# Patient Record
Sex: Male | Born: 1945 | Race: Black or African American | Hispanic: Yes | Marital: Married | Smoking: Never smoker
Health system: Southern US, Community
[De-identification: ages and names within clinical notes are randomized; demographics above are authoritative.]

## PROBLEM LIST (undated history)

## (undated) DIAGNOSIS — R55 Syncope and collapse: Secondary | ICD-10-CM

## (undated) DIAGNOSIS — I340 Nonrheumatic mitral (valve) insufficiency: Secondary | ICD-10-CM

## (undated) DIAGNOSIS — I471 Supraventricular tachycardia: Secondary | ICD-10-CM

## (undated) DIAGNOSIS — I4719 Other supraventricular tachycardia: Secondary | ICD-10-CM

## (undated) DIAGNOSIS — N400 Enlarged prostate without lower urinary tract symptoms: Secondary | ICD-10-CM

## (undated) DIAGNOSIS — G5600 Carpal tunnel syndrome, unspecified upper limb: Secondary | ICD-10-CM

## (undated) DIAGNOSIS — E039 Hypothyroidism, unspecified: Secondary | ICD-10-CM

## (undated) DIAGNOSIS — I495 Sick sinus syndrome: Secondary | ICD-10-CM

## (undated) DIAGNOSIS — I959 Hypotension, unspecified: Secondary | ICD-10-CM

## (undated) DIAGNOSIS — Z95810 Presence of automatic (implantable) cardiac defibrillator: Secondary | ICD-10-CM

## (undated) DIAGNOSIS — I2699 Other pulmonary embolism without acute cor pulmonale: Secondary | ICD-10-CM

## (undated) DIAGNOSIS — I428 Other cardiomyopathies: Secondary | ICD-10-CM

## (undated) DIAGNOSIS — I10 Essential (primary) hypertension: Secondary | ICD-10-CM

## (undated) DIAGNOSIS — E785 Hyperlipidemia, unspecified: Secondary | ICD-10-CM

## (undated) HISTORY — PX: BLADDER SURGERY: SHX569

---

## 2009-11-14 HISTORY — PX: INSERTION OF ICD: SHX6689

## 2015-05-01 HISTORY — PX: HERNIA REPAIR: SHX51

## 2016-02-05 ENCOUNTER — Ambulatory Visit (HOSPITAL_BASED_OUTPATIENT_CLINIC_OR_DEPARTMENT_OTHER)
Admission: RE | Admit: 2016-02-05 | Discharge: 2016-02-05 | Disposition: A | Discharge status: Home / Self Care | Payer: Medicare Other | Source: Ambulatory Visit | Attending: Internal Medicine | Admitting: Internal Medicine

## 2016-02-05 ENCOUNTER — Ambulatory Visit (HOSPITAL_COMMUNITY)
Admission: RE | Admit: 2016-02-05 | Discharge: 2016-02-05 | Disposition: A | Discharge status: Home / Self Care | Payer: Medicare Other | Source: Ambulatory Visit | Attending: Internal Medicine | Admitting: Internal Medicine

## 2016-02-05 ENCOUNTER — Encounter: Payer: Self-pay | Admitting: Internal Medicine

## 2016-02-05 ENCOUNTER — Encounter (HOSPITAL_COMMUNITY): Payer: Self-pay | Admitting: Internal Medicine

## 2016-02-05 VITALS — BP 100/72 | HR 82 | Resp 18 | Wt 160.5 lb

## 2016-02-05 DIAGNOSIS — Z95810 Presence of automatic (implantable) cardiac defibrillator: Secondary | ICD-10-CM | POA: Diagnosis not present

## 2016-02-05 DIAGNOSIS — I110 Hypertensive heart disease with heart failure: Secondary | ICD-10-CM | POA: Insufficient documentation

## 2016-02-05 DIAGNOSIS — E039 Hypothyroidism, unspecified: Secondary | ICD-10-CM | POA: Insufficient documentation

## 2016-02-05 DIAGNOSIS — Z8249 Family history of ischemic heart disease and other diseases of the circulatory system: Secondary | ICD-10-CM | POA: Diagnosis not present

## 2016-02-05 DIAGNOSIS — I495 Sick sinus syndrome: Secondary | ICD-10-CM | POA: Diagnosis not present

## 2016-02-05 DIAGNOSIS — Z888 Allergy status to other drugs, medicaments and biological substances status: Secondary | ICD-10-CM | POA: Insufficient documentation

## 2016-02-05 DIAGNOSIS — I5032 Chronic diastolic (congestive) heart failure: Secondary | ICD-10-CM | POA: Diagnosis not present

## 2016-02-05 DIAGNOSIS — C679 Malignant neoplasm of bladder, unspecified: Secondary | ICD-10-CM | POA: Insufficient documentation

## 2016-02-05 DIAGNOSIS — Z79899 Other long term (current) drug therapy: Secondary | ICD-10-CM | POA: Diagnosis not present

## 2016-02-05 DIAGNOSIS — I509 Heart failure, unspecified: Secondary | ICD-10-CM

## 2016-02-05 DIAGNOSIS — I4891 Unspecified atrial fibrillation: Secondary | ICD-10-CM | POA: Diagnosis not present

## 2016-02-05 DIAGNOSIS — Z7982 Long term (current) use of aspirin: Secondary | ICD-10-CM | POA: Insufficient documentation

## 2016-02-05 DIAGNOSIS — Z86711 Personal history of pulmonary embolism: Secondary | ICD-10-CM | POA: Diagnosis not present

## 2016-02-05 DIAGNOSIS — Z7901 Long term (current) use of anticoagulants: Secondary | ICD-10-CM | POA: Diagnosis not present

## 2016-02-05 DIAGNOSIS — Z823 Family history of stroke: Secondary | ICD-10-CM | POA: Diagnosis not present

## 2016-02-05 DIAGNOSIS — E785 Hyperlipidemia, unspecified: Secondary | ICD-10-CM | POA: Diagnosis not present

## 2016-02-05 HISTORY — DX: Other supraventricular tachycardia: I47.19

## 2016-02-05 HISTORY — DX: Hypothyroidism, unspecified: E03.9

## 2016-02-05 HISTORY — DX: Supraventricular tachycardia: I47.1

## 2016-02-05 HISTORY — DX: Hyperlipidemia, unspecified: E78.5

## 2016-02-05 HISTORY — DX: Sick sinus syndrome: I49.5

## 2016-02-05 HISTORY — DX: Other pulmonary embolism without acute cor pulmonale: I26.99

## 2016-02-05 HISTORY — DX: Benign prostatic hyperplasia without lower urinary tract symptoms: N40.0

## 2016-02-05 HISTORY — DX: Other cardiomyopathies: I42.8

## 2016-02-05 HISTORY — DX: Nonrheumatic mitral (valve) insufficiency: I34.0

## 2016-02-05 HISTORY — DX: Essential (primary) hypertension: I10

## 2016-02-05 HISTORY — DX: Syncope and collapse: R55

## 2016-02-05 LAB — COMPREHENSIVE METABOLIC PANEL
ALBUMIN: 3.3 g/dL — AB (ref 3.5–5.0)
ALK PHOS: 157 U/L — AB (ref 38–126)
ALT: 18 U/L (ref 17–63)
ANION GAP: 11 (ref 5–15)
AST: 36 U/L (ref 15–41)
BUN: 27 mg/dL — ABNORMAL HIGH (ref 6–20)
CALCIUM: 9.6 mg/dL (ref 8.9–10.3)
CO2: 27 mmol/L (ref 22–32)
Chloride: 100 mmol/L — ABNORMAL LOW (ref 101–111)
Creatinine, Ser: 1.25 mg/dL — ABNORMAL HIGH (ref 0.61–1.24)
GFR calc Af Amer: >60 mL/min (ref >60)
GFR calc non Af Amer: 57 mL/min — ABNORMAL LOW (ref >60)
GLUCOSE: 79 mg/dL (ref 65–99)
POTASSIUM: 4.3 mmol/L (ref 3.5–5.1)
SODIUM: 138 mmol/L (ref 135–145)
Total Bilirubin: 2.1 mg/dL — ABNORMAL HIGH (ref 0.3–1.2)
Total Protein: 7.2 g/dL (ref 6.5–8.1)

## 2016-02-05 LAB — CBC
HEMATOCRIT: 38.1 % — AB (ref 39.0–52.0)
HEMOGLOBIN: 12.6 g/dL — AB (ref 13.0–17.0)
MCH: 32.6 pg (ref 26.0–34.0)
MCHC: 33.1 g/dL (ref 30.0–36.0)
MCV: 98.7 fL (ref 78.0–100.0)
Platelets: 136 10*3/uL — ABNORMAL LOW (ref 150–400)
RBC: 3.86 MIL/uL — ABNORMAL LOW (ref 4.22–5.81)
RDW: 15.1 % (ref 11.5–15.5)
WBC: 5 10*3/uL (ref 4.0–10.5)

## 2016-02-05 LAB — ECHOCARDIOGRAM COMPLETE
EWDT: 222 ms
FS: 21 % — AB (ref 28–44)
IVS/LV PW RATIO, ED: 0.68
LDCA: 3.14 cm2
LEFT ATRIUM END SYS DIAM: 47 cm
LV PW d: 25 mm — AB (ref 0.6–1.1)
Simpson's disk: 45
Weight: 2568 oz

## 2016-02-05 LAB — FERRITIN: Ferritin: 129 ng/mL (ref 24–336)

## 2016-02-05 MED ORDER — POTASSIUM CHLORIDE CRYS ER 20 MEQ PO TBCR: EXTENDED_RELEASE_TABLET | ORAL | Status: DC

## 2016-02-05 MED ORDER — METOLAZONE 2.5 MG PO TABS: ORAL_TABLET | ORAL | Status: DC

## 2016-02-05 NOTE — Progress Notes (Signed)
Patient ID: Kirk Perkins, male   DOB: Aug 18, 1946, 70 y.o.   MRN: [id number]   ADVANCED HF CLINIC CONSULT NOTE  Referring Physician: Dr. Patsi Perkins ([place]) Primary Cardiologist: Dr. Chancy Perkins  HPI:  Mr. Kirk Perkins is a 70 y/o male with HTN, HL, hypertrophic/restrictive CM with recurrent syncope s/p St. JudeICD, atrial tachycardia and PE referred by Dr. Chancy Perkins for further HF evaluation.  Mr. Kirk Perkins has appeared to carry the diagnosis of HOCM for over 15 years based on echo findings of LVH and mild SAM. Underwent ST Jude ICD placement in 2009 due to recurrent syncope.  Recently struggling more with volume overload and SOB. He has multiple admissions to the hospital for LE edema requiring IV diuresis. Echo in 10/16 reportedly showed EF 50-55% with moderate- severe (progressive) LVH with increased echogenicity raising suspicion for amyloid. Fat abdominal fat pas biopsy which was normal. Last admission was in 1/17 removed 20 pounds of fluid. Switched from lasix to bumex 1.5 bid and that has helped significantly. Never takes extra. Weighing every day. Weight relatively stable 155-160.  Watching fluid and salt intake very carefully. No orthopnea/PND. Sleeps in recliner. + edema. Does not recall having super high BP when he was a young man. Denies ETOH abuse   R/L Cath 10/16 Minimal CAD LM 30-40% LAD ok LCX RCA 40%p 30% mid  LVEF 80% LVEDP 15 RA 16 RV 36/10 PA 30/13 Fick CO 3.1/1.6  St Jude interrogation: AF at least since 12/16 (maybe longer) poor sensing on atrial lead. Battery life 4 months left    Review of Systems: [y] = yes, [ ]  = no   General: Weight gain [ y]; Weight loss [ ] ; Anorexia [ ] ; Fatigue Blue.Reese ]; Fever [ ] ; Chills [ ] ; Weakness Blue.Reese ]  Cardiac: Chest pain/pressure [ ] ; Resting SOB [ ] ; Exertional SOB [ y]; Orthopnea [ ] ; Pedal Edema [ y]; Palpitations [ ] ; Syncope [ y]; Presyncope [ ] ; Paroxysmal nocturnal dyspnea[ ]   Pulmonary: Cough [ ] ; Wheezing[ ] ; Hemoptysis[  ]; Sputum [ ] ; Snoring [ ]   GI: Vomiting[ ] ; Dysphagia[ ] ; Melena[ ] ; Hematochezia [ ] ; Heartburn[ ] ; Abdominal pain [ ] ; Constipation [ ] ; Diarrhea [ ] ; BRBPR [ ]   GU: Hematuria[ ] ; Dysuria [ ] ; Nocturia[ ]   Vascular: Pain in legs with walking [ ] ; Pain in feet with lying flat [ ] ; Non-healing sores [ ] ; Stroke [ ] ; TIA [ ] ; Slurred speech [ ] ;  Neuro: Headaches[ ] ; Vertigo[ ] ; Seizures[ ] ; Paresthesias[ ] ;Blurred vision [ ] ; Diplopia [ ] ; Vision changes [ ]   Ortho/Skin: Arthritis Blue.Reese ]; Joint pain Blue.Reese ]; Muscle pain [ ] ; Joint swelling [ ] ; Back Pain [ ] ; Rash [ ]   Psych: Depression[ ] ; Anxiety[ ]   Heme: Bleeding problems [ ] ; Clotting disorders [ ] ; Anemia [ ]   Endocrine: Diabetes [ ] ; Thyroid dysfunction[ ]    Past Medical History  Diagnosis Date  . Syncope   . Obstructive cardiomyopathy ([place])   . Sick sinus syndrome ([place])   . HTN (hypertension)   . Hypothyroidism   . Benign prostatic hypertrophy   . Hyperlipidemia   . Atrial tachycardia ([place])   . Pulmonary emboli ([place])   . Mitral valve insufficiency     Current Outpatient Prescriptions  Medication Sig Dispense Refill  . allopurinol (ZYLOPRIM) 100 MG tablet Take 100 mg by mouth daily.    Marland Kitchen apixaban (ELIQUIS) 5 MG TABS tablet Take 5 mg by mouth 2 (two) times daily.    Marland Kitchen  aspirin 81 MG tablet Take 81 mg by mouth daily.    . bumetanide (BUMEX) 1 MG tablet Take 1.5 mg by mouth 2 (two) times daily.    . calcitRIOL (ROCALTROL) 0.25 MCG capsule Take 0.25 mcg by mouth 4 (four) times a week.    . carvedilol (COREG) 3.125 MG tablet Take 3.125 mg by mouth 2 (two) times daily with a meal.    . ferrous sulfate 325 (65 FE) MG tablet Take 325 mg by mouth daily with breakfast.    . levothyroxine (SYNTHROID, LEVOTHROID) 88 MCG tablet Take 88 mcg by mouth daily before breakfast.    . lovastatin (MEVACOR) 20 MG tablet Take 20 mg by mouth at bedtime.    . tamsulosin (FLOMAX) 0.4 MG CAPS capsule Take 0.4 mg by mouth daily after supper.     No  current facility-administered medications for this encounter.    Allergies  Allergen Reactions  . Ace Inhibitors Swelling      Social History   Social History  . Marital Status: Married    Spouse Name: N/A  . Number of Children: 3  . Years of Education: N/A   Occupational History  . retired    Social History Main Topics  . Smoking status: Not on file  . Smokeless tobacco: Not on file  . Alcohol Use: No  . Drug Use: Not on file  . Sexual Activity: Not on file   Other Topics Concern  . Not on file   Social History Narrative      Family History  Problem Relation Age of Onset  . Stroke Father   Mother had HTN died from natural causes 2 sisters: no heart problems No family H/o cardiomyopathy  Filed Vitals:   02/05/16 1206  BP: 100/72  Pulse: 82  Resp: 18  Weight: 160 lb 8 oz (72.802 kg)  SpO2: 98%    PHYSICAL EXAM: General:  Elderly male NAD. No respiratory difficulty HEENT: normal Neck: supple. JVP to jaw with prominent CV waves Carotids 2+ bilat; no bruits. No lymphadenopathy or thryomegaly appreciated. Cor: PMI nondisplaced. Regular rate & rhythm. 2/6 TR  Lungs: clear Abdomen: soft, nontender, nondistended. Liver edge down slightly No bruits or masses. Good bowel sounds. Extremities: no cyanosis, clubbing, rash, 2+ chronic woody edema Neuro: alert & oriented x 3, cranial nerves grossly intact. moves all 4 extremities w/o difficulty. Affect pleasant.  ECG: Atrial fibrillation with v-pacing normal voltage   ASSESSMENT & PLAN: 1. Chronic diastolic HF due to possible hypertrophic CM s/p St Jude ICD 2. Recurrent AF    --CHADSVASC = 4 This patients CHA2DS2-VASc Score and unadjusted Ischemic Stroke Rate (% per year) is equal to 4.8 % stroke rate/year from a score of 4 3. St Jude ICD    --4 months left on battery. Low sensing on a-lead 4. HTN 5. H/o PE  6. Bladder CA  Very complicated case. He has R>>L heart failure with great difficulty managing his  volume status.   My first concern when reviewing his case was for cardiac amyloidosis given his RHF and severe LVH out of proportion to his reported level of HTN. However his volts on ECG are normal and has had a negative fat pad biopsy. Unfortunately cannot get cMRI to further evaluate due ICD. Will repeat echo and check ferritin, SPEP and UPEP. He may also have hypertrophic CM but he carries no FHx for this.   lthough he may have a component of restriction RHC results most c/w primary RV  failure and not biventricular failure. Recurrent AF may also be playing a role.  For now, I would like to repeat his echo and once we have these images we can make a decision on next steps. Suspect we will need to repeat his RHC. Given residual volume overload will add metolazone 2.5 2x/week with kcl 20 each time. If atria not overly enlarged on echo may consider DC-CV as well. Need for endomyocardial biopsy remains a possibility.   Will have him see EP regard device EOL and poor sensing on atrial lead.   Bensimhon, Daniel,MD 5:46 PM

## 2016-02-05 NOTE — Progress Notes (Signed)
*  PRELIMINARY RESULTS* Echocardiogram 2D Echocardiogram has been performed.  Kirk Perkins 02/05/2016, 2:52 PM

## 2016-02-05 NOTE — Patient Instructions (Addendum)
Echocardiogram today.  Routine lab work performed today. Will call you with ABNORMAL results.  Take Metolazone 2.5mg  every Monday, Wednesday, and Friday.  Take Potassium 7meq with Metolazone.  Follow up in 3 weeks with Dr.Klein and Dr.Bensimhon.

## 2016-02-06 LAB — PROTEIN ELECTROPHORESIS, SERUM
A/G Ratio: 0.9 (ref 0.7–1.7)
ALPHA-1-GLOBULIN: 0.3 g/dL (ref 0.0–0.4)
Albumin ELP: 3.6 g/dL (ref 2.9–4.4)
Alpha-2-Globulin: 0.6 g/dL (ref 0.4–1.0)
Beta Globulin: 1.2 g/dL (ref 0.7–1.3)
GAMMA GLOBULIN: 1.8 g/dL (ref 0.4–1.8)
GLOBULIN, TOTAL: 3.9 g/dL (ref 2.2–3.9)
TOTAL PROTEIN ELP: 7.5 g/dL (ref 6.0–8.5)

## 2016-02-06 LAB — PROTEIN ELECTRO, RANDOM URINE
ALBUMIN ELP UR: 100 %
Alpha-1-Globulin, U: 0 %
Alpha-2-Globulin, U: 0 %
BETA GLOBULIN, U: 0 %
GAMMA GLOBULIN, U: 0 %
TOTAL PROTEIN, URINE-UPE24: 10.8 mg/dL

## 2016-02-12 ENCOUNTER — Encounter (HOSPITAL_COMMUNITY): Payer: Self-pay

## 2016-02-12 NOTE — Progress Notes (Signed)
Adv HF consult note, echo, and labs from new patient appointment visit faxed to Dr. Chancy Milroy with [facility] per Dr. bensimhon's request.

## 2016-02-24 ENCOUNTER — Ambulatory Visit (HOSPITAL_COMMUNITY)
Admission: RE | Admit: 2016-02-24 | Discharge: 2016-02-24 | Disposition: A | Discharge status: Home / Self Care | Payer: Medicare Other | Source: Ambulatory Visit | Attending: Internal Medicine | Admitting: Internal Medicine

## 2016-02-24 ENCOUNTER — Encounter: Payer: Self-pay | Admitting: Internal Medicine

## 2016-02-24 ENCOUNTER — Ambulatory Visit (INDEPENDENT_AMBULATORY_CARE_PROVIDER_SITE_OTHER): Payer: Medicare Other | Admitting: Internal Medicine

## 2016-02-24 ENCOUNTER — Encounter (HOSPITAL_COMMUNITY): Payer: Self-pay | Admitting: Internal Medicine

## 2016-02-24 VITALS — BP 90/70 | HR 80 | Ht 70.0 in | Wt 148.2 lb

## 2016-02-24 VITALS — BP 106/64 | HR 76 | Wt 148.2 lb

## 2016-02-24 DIAGNOSIS — Z86711 Personal history of pulmonary embolism: Secondary | ICD-10-CM | POA: Insufficient documentation

## 2016-02-24 DIAGNOSIS — Z95810 Presence of automatic (implantable) cardiac defibrillator: Secondary | ICD-10-CM | POA: Insufficient documentation

## 2016-02-24 DIAGNOSIS — Z7982 Long term (current) use of aspirin: Secondary | ICD-10-CM | POA: Diagnosis not present

## 2016-02-24 DIAGNOSIS — E039 Hypothyroidism, unspecified: Secondary | ICD-10-CM | POA: Insufficient documentation

## 2016-02-24 DIAGNOSIS — I5032 Chronic diastolic (congestive) heart failure: Secondary | ICD-10-CM

## 2016-02-24 DIAGNOSIS — I509 Heart failure, unspecified: Secondary | ICD-10-CM

## 2016-02-24 DIAGNOSIS — Z7901 Long term (current) use of anticoagulants: Secondary | ICD-10-CM | POA: Insufficient documentation

## 2016-02-24 DIAGNOSIS — I340 Nonrheumatic mitral (valve) insufficiency: Secondary | ICD-10-CM | POA: Diagnosis not present

## 2016-02-24 DIAGNOSIS — C679 Malignant neoplasm of bladder, unspecified: Secondary | ICD-10-CM | POA: Diagnosis not present

## 2016-02-24 DIAGNOSIS — I480 Paroxysmal atrial fibrillation: Secondary | ICD-10-CM

## 2016-02-24 DIAGNOSIS — N400 Enlarged prostate without lower urinary tract symptoms: Secondary | ICD-10-CM | POA: Diagnosis not present

## 2016-02-24 DIAGNOSIS — I110 Hypertensive heart disease with heart failure: Secondary | ICD-10-CM | POA: Insufficient documentation

## 2016-02-24 DIAGNOSIS — I4891 Unspecified atrial fibrillation: Secondary | ICD-10-CM | POA: Insufficient documentation

## 2016-02-24 DIAGNOSIS — G629 Polyneuropathy, unspecified: Secondary | ICD-10-CM

## 2016-02-24 DIAGNOSIS — Z823 Family history of stroke: Secondary | ICD-10-CM | POA: Insufficient documentation

## 2016-02-24 DIAGNOSIS — I5022 Chronic systolic (congestive) heart failure: Secondary | ICD-10-CM | POA: Diagnosis present

## 2016-02-24 DIAGNOSIS — Z888 Allergy status to other drugs, medicaments and biological substances status: Secondary | ICD-10-CM | POA: Diagnosis not present

## 2016-02-24 DIAGNOSIS — I495 Sick sinus syndrome: Secondary | ICD-10-CM | POA: Diagnosis not present

## 2016-02-24 DIAGNOSIS — Z79899 Other long term (current) drug therapy: Secondary | ICD-10-CM | POA: Insufficient documentation

## 2016-02-24 DIAGNOSIS — Z8249 Family history of ischemic heart disease and other diseases of the circulatory system: Secondary | ICD-10-CM | POA: Diagnosis not present

## 2016-02-24 DIAGNOSIS — E785 Hyperlipidemia, unspecified: Secondary | ICD-10-CM | POA: Diagnosis not present

## 2016-02-24 LAB — BASIC METABOLIC PANEL
Anion gap: 13 (ref 5–15)
BUN: 63 mg/dL — AB (ref 6–20)
CALCIUM: 9.5 mg/dL (ref 8.9–10.3)
CHLORIDE: 92 mmol/L — AB (ref 101–111)
CO2: 30 mmol/L (ref 22–32)
CREATININE: 1.4 mg/dL — AB (ref 0.61–1.24)
GFR calc Af Amer: 57 mL/min — ABNORMAL LOW (ref >60)
GFR calc non Af Amer: 49 mL/min — ABNORMAL LOW (ref >60)
Glucose, Bld: 88 mg/dL (ref 65–99)
Potassium: 3.7 mmol/L (ref 3.5–5.1)
SODIUM: 135 mmol/L (ref 135–145)

## 2016-02-24 LAB — CUP PACEART INCLINIC DEVICE CHECK
Battery Remaining Longevity: 2.8
Battery Voltage: 2.48 V
Brady Statistic RA Percent Paced: 4.6 %
Brady Statistic RV Percent Paced: 23 %
HIGH POWER IMPEDANCE MEASURED VALUE: 67.5 Ohm
Implantable Lead Implant Date: 20101217
Implantable Lead Implant Date: 20101217
Implantable Lead Location: 753859
Lead Channel Impedance Value: 425 Ohm
Lead Channel Pacing Threshold Amplitude: 1 V
Lead Channel Sensing Intrinsic Amplitude: 0.3 mV
Lead Channel Setting Pacing Amplitude: 2.5 V
Lead Channel Setting Pacing Amplitude: 2.5 V
Lead Channel Setting Pacing Pulse Width: 0.5 ms
MDC IDC LEAD LOCATION: 753860
MDC IDC MSMT LEADCHNL RV IMPEDANCE VALUE: 350 Ohm
MDC IDC MSMT LEADCHNL RV PACING THRESHOLD PULSEWIDTH: 0.5 ms
MDC IDC MSMT LEADCHNL RV SENSING INTR AMPL: 8.1 mV
MDC IDC PG SERIAL: 544575
MDC IDC SESS DTM: 20170327155443
MDC IDC SET LEADCHNL RV SENSING SENSITIVITY: 0.3 mV

## 2016-02-24 MED ORDER — METOLAZONE 2.5 MG PO TABS: 2.5000 mg | ORAL_TABLET | ORAL | Status: DC

## 2016-02-24 NOTE — Progress Notes (Signed)
Patient ID: Kirk Perkins, male   DOB: [birth year], 70 y.o.   MRN: [id number]   ADVANCED HF CLINIC NOTE  Referring Physician: Dr. Patsi Sears ([place]) Primary Cardiologist: Dr. Chancy Milroy  HPI:  Kirk Perkins is a 70 y/o male with HTN, HL, hypertrophic/restrictive CM with recurrent syncope s/p St. Jude ICD, atrial tachycardia and PE referred by Dr. Chancy Milroy for further HF evaluation.  Kirk Perkins has appeared to carry the diagnosis of HOCM for over 15 years based on echo findings of LVH and mild SAM. Underwent ST Jude ICD placement in 2009 due to recurrent syncope.  We saw him for the first time in consult 02/05/16 as he was struggling more with volume overload and SOB. He has multiple admissions to the hospital for LE edema requiring IV diuresis. Echo in 10/16 reportedly showed EF 50-55% with moderate- severe (progressive) LVH with increased echogenicity raising suspicion for amyloid. Fat abdominal fat pas biopsy which was normal. At that time we added metolazone 2.5 2x/week with kcl 20 each time. SPEP/UPEP negative.Ferritin ok.  Repeat echo ordered EF 40-45% with very thick speckled myocardium highly suggestive of amyloid. Severe septal HK. RV moderately decreased function. LA 4.7 cm with moderate MR.  Also found to be back in AF since 12/16 on device interrogation.   Here is here for f/u: Says he has been taking metolazone 3x/week (M/W/F) - we had initially written for twice per week. Weight down 12 pounds since we last saw him. Breathing doing better. Can do ADLs without too much problem. BP can run  90-105 but no dizziness. Edema improved. No palpitations.   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 Battery life 4 months left     Past Medical History  Diagnosis Date  . Syncope   . Obstructive  cardiomyopathy ([place])   . Sick sinus syndrome ([place])   . HTN (hypertension)   . Hypothyroidism   . Benign prostatic hypertrophy   . Hyperlipidemia   . Atrial tachycardia ([place])   . Pulmonary emboli ([place])   . Mitral valve insufficiency     Current Outpatient Prescriptions  Medication Sig Dispense Refill  . allopurinol (ZYLOPRIM) 100 MG tablet Take 100 mg by mouth daily.    Marland Kitchen apixaban (ELIQUIS) 5 MG TABS tablet Take 5 mg by mouth 2 (two) times daily.    Marland Kitchen aspirin 81 MG tablet Take 81 mg by mouth daily.    . bumetanide (BUMEX) 1 MG tablet Take 1.5 mg by mouth 2 (two) times daily.    . calcitRIOL (ROCALTROL) 0.25 MCG capsule Take 0.25 mcg by mouth 4 (four) times a week.    . ferrous sulfate 325 (65 FE) MG tablet Take 325 mg by mouth daily with breakfast.    . levothyroxine (SYNTHROID, LEVOTHROID) 88 MCG tablet Take 88 mcg by mouth daily before breakfast.    . lovastatin (MEVACOR) 20 MG tablet Take 20 mg by mouth at bedtime.    . metolazone (ZAROXOLYN) 2.5 MG tablet Take every Monday, Wednesday, and Friday 15 tablet 3  . potassium chloride SA (K-DUR,KLOR-CON) 20 MEQ tablet Take 1 tablet Every Monday, Wednesday, and Friday with Metolazone. 15 tablet 3  . tamsulosin (FLOMAX) 0.4 MG CAPS capsule Take 0.4 mg by mouth daily after supper.  No current facility-administered medications for this encounter.    Allergies  Allergen Reactions  . Ace Inhibitors Swelling      Social History   Social History  . Marital Status: Married    Spouse Name: N/A  . Number of Children: 3  . Years of Education: N/A   Occupational History  . retired    Social History Main Topics  . Smoking status: Never Smoker   . Smokeless tobacco: Not on file  . Alcohol Use: No  . Drug Use: Not on file  . Sexual Activity: Not on file   Other Topics Concern  . Not on file   Social History Narrative      Family History  Problem Relation Age of Onset  . Stroke Father   Mother had HTN died from natural  causes 2 sisters: no heart problems No family H/o cardiomyopathy  Filed Vitals:   02/24/16 1101  BP: 106/64  Pulse: 76  Weight: 148 lb 4 oz (67.246 kg)  SpO2: 97%    PHYSICAL EXAM: General:  Elderly male NAD. No respiratory difficulty HEENT: normal Neck: supple. JVP to jaw with prominent CV waves Carotids 2+ bilat; no bruits. No lymphadenopathy or thryomegaly appreciated. Cor: PMI nondisplaced. Regular rate & rhythm. 2/6 TR  Lungs: clear Abdomen: soft, nontender, nondistended. Liver edge down slightly No bruits or masses. Good bowel sounds. Extremities: no cyanosis, clubbing, rash, 1+ chronic woody edema Neuro: alert & oriented x 3, cranial nerves grossly intact. moves all 4 extremities w/o difficulty. Affect pleasant.  ECG: Atrial fibrillation with v-pacing normal voltage   ASSESSMENT & PLAN: 1. Chronic systolic/diastolic HF due to possible hypertrophic CM s/p St Jude ICD with R>>L heart failure symptoms     --Echo 02/05/16 EF 40-45% with very thick speckled myocardium highly suggestive of amyloid. Severe septal HK. RV moderately decreased function. LA 4.7 cm with moderate MR.     --NYHA II-III. Volume status improving with metolazone but still elevated. Will cut metolazone back to 2x/week to avoid overdiuresis particularly with RV failure     --Long talk with him and his family about situation. In looking at his echo, I think that he has amyloid until proven otherwise. Given negative SPEP/UPEP my concern is for TTR amyloidosis. This is confounded by negative fat pad biopsy and normal volts on ECG. I feel strongly that he needs endomyocardial biopsy and will arrange this with Dr. Pilar Plate at [facility].  2. Recurrent AF    --CHADSVASC = 4 This patients CHA2DS2-VASc Score and unadjusted Ischemic Stroke Rate (% per year) is equal to 4.8 % stroke rate/year from a score of 4     --He has been in AF since 12/16 by ICD interrogation. He is on apixaban. My initial feeling was to plan DC-CV hoping that  restoration of NSR would markedly improve his HF especially in light of his advanced diastolic dysfunction. However his symptoms are markedly improved with diuresis and he has a LA diameter of nearly 5.0cm making recurrence likely. With need to stop [facility] for biopsy will hold off on DC-CV for now. If we choose DC-CV he will likely need amio.  3. St Jude ICD    --Now at [facility]. Saw Dr Caryl Comes today. Will discuss with him.  4. HTN    --BP now on low side 5. H/o PE     --remains on apixaban 6. Bladder CA   Elester Apodaca,MD 11:40 AM

## 2016-02-24 NOTE — Progress Notes (Signed)
ELECTROPHYSIOLOGY CONSULT NOTE  Patient ID: Kirk Perkins, MRN: [id number], DOB/AGE: [birth year] 70 y.o. Admit date: (Not on file) Date of Consult: 02/24/2016  Primary Physician: PROVIDER NOT IN SYSTEM Primary Cardiologist: DB Consulting Physician CHF  Chief Complaint:     HPI Kirk Perkins is a 70 y.o. male  Referred for previously implanted ICD now at [facility].  He has poor sensing was atrial lead.  He has a history of hypertrophic/restrictive cardiomyopathy with syncope. Ejection fraction 10/16 demonstrated EF 50%. Wall thicknesses were described as 25 mm posteriorly and 17 mm and the septum.  Left atrial enlargement 47. Index volumes are not available qualitatively moderate-severe  He has heart failure with right than left with peripheral edema which is markedly improved on the new diuretic regime.  There are issues regarding amyloid;  Echogenicity raises a question about amyloid. Fat pad  biopsies were negative.  Biopsy apparently is anticipated. Interestingly voltages are normal. He also has a peripheral neuropathy so he cannot button things.  He's a history of atrial fibrillation and he has been persistently out of rhythm since 12/16.     Past Medical History  Diagnosis Date  . Syncope   . Obstructive cardiomyopathy ([place])   . Sick sinus syndrome ([place])   . HTN (hypertension)   . Hypothyroidism   . Benign prostatic hypertrophy   . Hyperlipidemia   . Atrial tachycardia ([place])   . Pulmonary emboli ([place])   . Mitral valve insufficiency       Surgical History:  Past Surgical History  Procedure Laterality Date  . Insertion of icd  11/14/2009  . Bladder surgery    . Hernia repair  June 2016     Home Meds: Prior to Admission medications   Medication Sig Start Date End Date Taking? Authorizing Provider  allopurinol (ZYLOPRIM) 100 MG tablet Take 100 mg by mouth daily.   Yes Historical Provider, MD  apixaban (ELIQUIS) 5 MG TABS tablet Take 5 mg by mouth 2 (two) times  daily.   Yes Historical Provider, MD  aspirin 81 MG tablet Take 81 mg by mouth daily.   Yes Historical Provider, MD  bumetanide (BUMEX) 1 MG tablet Take 1.5 mg by mouth 2 (two) times daily.   Yes Historical Provider, MD  calcitRIOL (ROCALTROL) 0.25 MCG capsule Take 0.25 mcg by mouth 4 (four) times a week.   Yes Historical Provider, MD  ferrous sulfate 325 (65 FE) MG tablet Take 325 mg by mouth daily with breakfast.   Yes Historical Provider, MD  levothyroxine (SYNTHROID, LEVOTHROID) 88 MCG tablet Take 88 mcg by mouth daily before breakfast.   Yes Historical Provider, MD  lovastatin (MEVACOR) 20 MG tablet Take 20 mg by mouth at bedtime.   Yes Historical Provider, MD  metolazone (ZAROXOLYN) 2.5 MG tablet Take 1 tablet (2.5 mg total) by mouth 2 (two) times a week. Every Monday and Friday 02/24/16  Yes Jolaine Artist, MD  potassium chloride SA (K-DUR,KLOR-CON) 20 MEQ tablet Take 1 tablet Every Monday, Wednesday, and Friday with Metolazone. 02/05/16  Yes Jolaine Artist, MD  tamsulosin (FLOMAX) 0.4 MG CAPS capsule Take 0.4 mg by mouth daily after supper.   Yes Historical Provider, MD    Allergies:  Allergies  Allergen Reactions  . Ace Inhibitors Swelling    Social History   Social History  . Marital Status: Married    Spouse Name: N/A  . Number of Children: 3  . Years of Education: N/A   Occupational History  .  retired    Social History Main Topics  . Smoking status: Never Smoker   . Smokeless tobacco: Not on file  . Alcohol Use: No  . Drug Use: Not on file  . Sexual Activity: Not on file   Other Topics Concern  . Not on file   Social History Narrative     Family History  Problem Relation Age of Onset  . Stroke Father      ROS:  Please see the history of present illness.     All other systems reviewed and negative.    Physical Exam   Blood pressure 90/70, pulse 80, height 5\' 10"  (1.778 m), weight 148 lb 3.2 oz (67.223 kg). General: Well developed, well nourished  male in no acute distress. Head: Normocephalic, atraumatic, sclera non-icteric, no xanthomas, nares are without discharge. EENT: normal  Lymph Nodes:  none Neck: Negative for carotid bruits. JVD 8-10Back:without scoliosis kyphosis Lungs: Clear bilaterally to auscultation without wheezes, rales, or rhonchi. Breathing is unlabored. Heart: RRR with S1 S2. 2/6 systolic  murmur . No rubs, or gallops appreciated. Abdomen: Soft, non-tender, non-distended with normoactive bowel sounds. No hepatomegaly. No rebound/guarding. No obvious abdominal masses. Msk:  Strength and tone appear normal for age. Extremities: No clubbing or cyanosis.    2+  edema.  Distal pedal pulses are 2+ and equal bilaterally. Skin: Warm and Dry Neuro: Alert and oriented X 3. CN III-XII intact Grossly normal sensory and motor function . Psych:  Responds to questions appropriately with a normal affect.      Labs: Cardiac Enzymes No results for input(s): CKTOTAL, CKMB, TROPONINI in the last 72 hours. CBC Lab Results  Component Value Date   WBC 5.0 02/05/2016   HGB 12.6* 02/05/2016   HCT 38.1* 02/05/2016   MCV 98.7 02/05/2016   PLT 136* 02/05/2016   PROTIME: No results for input(s): LABPROT, INR in the last 72 hours. Chemistry  Recent Labs Lab 02/24/16 1231  NA 135  K 3.7  CL 92*  CO2 30  BUN 63*  CREATININE 1.40*  CALCIUM 9.5  GLUCOSE 88   Lipids No results found for: CHOL, HDL, LDLCALC, TRIG BNP No results found for: PROBNP Thyroid Function Tests: No results for input(s): TSH, T4TOTAL, T3FREE, THYROIDAB in the last 72 hours.  Invalid input(s): FREET3 Miscellaneous No results found for: DDIMER  Radiology/Studies:  No results found.  EKG: atrial fibrillation 80 Intervals-/12/43 Axis -71   Assessment and Plan:   Atrial fibrillation   Persistent  Cardiomyopathy with evidence of severe hypertrophy question mechanism  Peripheral neuropathy  Congestive heart failure) greater than  left  Implantable defibrillator-St. Jude now at [facility]    The patient has significant hypertrophy by echo; somewhat discordant we with the posterior wall being 25 and the septum being 17??. This raises a concern of hypertrophic cardiomyopathy as statistically most likely cause, especially given normal voltage on his ECG. The neuropathy however raises the concerns about other infiltrative processes like amyloid.  2 strategies present themselves, the first is to proceed with biopsy as anticipated. The second would be to do family screening of first-degree relatives, i.e. His surviving sister and his 3 children.  If he goes for cardiac biopsy, it would be appropriate at that juncture to undertake generator replacement. We are glad to do when she of course; however, if the biopsy is going to be done in [place] it can be done there. Assessment of his atrial lead is best accomplished following restoration of sinus rhythm.  Management  of his heart failure may well be improved with restoration of sinus rhythm particularly given his hypertrophic/diastolic heart disease.  I would pursue cardioversion not withstanding his modest left atrial enlargement given the fact that he was in sinus rhythm until about 3 months ago. He may well benefit from amiodarone adjunctively.  We will stop his aspirin    Virl Axe

## 2016-02-24 NOTE — Patient Instructions (Signed)
Decrease Metolazone to 2.5 mg every Monday and Friday ONLY  Labs today  You have been referred to Dr Artemio Aly at [facility] for a biopsy, they will contact you to schedule  Your physician recommends that you schedule a follow-up appointment in: 1 month

## 2016-02-24 NOTE — Patient Instructions (Addendum)
Medication Instructions: 1) Stop Aspirin  Labwork: - none  Procedures/Testing: - none  Follow-Up: - You have been referred to : [facility]- evaluation of peripheral neuropathy  - Dr. Caryl Comes will see you back as needed pending recommendations from Dr. Haroldine Laws.  Any Additional Special Instructions Will Be Listed Below (If Applicable).     If you need a refill on your cardiac medications before your next appointment, please call your pharmacy.

## 2016-02-24 NOTE — Progress Notes (Signed)
Advanced Heart Failure Medication Review by a Pharmacist  Does the patient  feel that his/her medications are working for him/her?  yes  Has the patient been experiencing any side effects to the medications prescribed?  no  Does the patient measure his/her own blood pressure or blood glucose at home?  yes   Does the patient have any problems obtaining medications due to transportation or finances?   no  Understanding of regimen: good Understanding of indications: good Potential of compliance: good Patient understands to avoid NSAIDs. Patient understands to avoid decongestants.  Issues to address at subsequent visits: None   Pharmacist comments:  Kirk Perkins is a pleasant 70 yo M presenting with his family members and his medication bottles. He reports good compliance with his regimen. His daughter states that he has not been on carvedilol for at least 2 months now 2/2 fainting. He has documented allergy to ACEi but does not remember this. No other medication-related questions or concerns for me at this time.   [name], PharmD, BCPS, CPP Clinical Pharmacist Pager: [phone] Phone: [phone] 02/24/2016 11:17 AM      Time with patient: 10 minutes Preparation and documentation time: 2 minutes Total time: 12 minutes

## 2016-03-19 ENCOUNTER — Encounter: Payer: Self-pay | Admitting: Neurology

## 2016-03-19 ENCOUNTER — Ambulatory Visit (INDEPENDENT_AMBULATORY_CARE_PROVIDER_SITE_OTHER): Payer: Medicare Other | Admitting: Neurology

## 2016-03-19 VITALS — BP 110/64 | HR 68 | Ht 70.0 in | Wt 141.2 lb

## 2016-03-19 DIAGNOSIS — M62838 Other muscle spasm: Secondary | ICD-10-CM

## 2016-03-19 DIAGNOSIS — M6250 Muscle wasting and atrophy, not elsewhere classified, unspecified site: Secondary | ICD-10-CM | POA: Insufficient documentation

## 2016-03-19 DIAGNOSIS — G5603 Carpal tunnel syndrome, bilateral upper limbs: Secondary | ICD-10-CM | POA: Diagnosis not present

## 2016-03-19 DIAGNOSIS — R42 Dizziness and giddiness: Secondary | ICD-10-CM

## 2016-03-19 DIAGNOSIS — M6249 Contracture of muscle, multiple sites: Secondary | ICD-10-CM

## 2016-03-19 DIAGNOSIS — G959 Disease of spinal cord, unspecified: Secondary | ICD-10-CM

## 2016-03-19 MED ORDER — TIZANIDINE HCL 2 MG PO CAPS: 2.0000 mg | ORAL_CAPSULE | Freq: Every evening | ORAL | Status: DC | PRN

## 2016-03-19 NOTE — Patient Instructions (Addendum)
1.  NCS/EMG of the upper extremities 2.  CT cervical spine 3.  Start tizanidine 2mg  at bedtime for muscle stiffness  Return to clinic in 3 months

## 2016-03-19 NOTE — Progress Notes (Signed)
[facility] Clinic Note - Initial Visit   Date: 03/19/2016  Kirk Perkins MRN: [id number] DOB: 1946/09/25   Dear Dr. Caryl Comes:  Thank you for your kind referral of Kirk Perkins for consultation of neuropathy. Although his history is well known to you, please allow [place] to reiterate it for the purpose of our medical record. The patient was accompanied to the clinic by daughter who also provides collateral information.     History of Present Illness: Kirk Perkins is a 70 y.o. left-handed African American male with hypertension, hypothyroidism, hyperlipidemia, atrial fibrillation, hypertrophic cardiomyopathy s/p PPM/ICD,  presenting for evaluation of neuropathy.    He is followed by Dr. Caryl Comes in cardiology for his hypertrophic cardiomyopathy and there is concern of possible amyloidosis, however his fat pad biopsies and SPEP/UPEP with IFE are negative.  Because patient has peripheral neuropathy, he was referred for further evaluation.  Upon further questions, patient reports that he has had numbness and weakness of the hands for the past several years.  He worked in [employer] for 34+ years and was constantly doing repetitive activity with his hands.  The numbness involves all of his fingers and does not extend past the palm or wrists.  Symptoms are constant and are not exacerbated by anything.  He wears gloves all the time because the warmth feels more comfortable.  He does not have paresthesias of the feet.   He has been using a cane for a number of years because of stiffness of his legs and neck.    Out-side paper records, electronic medical record, and images have been reviewed where available and summarized as:  Labs 02/05/2016:  SPEP/UPEP with IFE no M protein, ferritin 129  Past Medical History  Diagnosis Date  . Syncope   . Obstructive cardiomyopathy ([place])   . Sick sinus syndrome ([place])   . HTN (hypertension)   . Hypothyroidism   . Benign prostatic hypertrophy     . Hyperlipidemia   . Atrial tachycardia ([place])   . Pulmonary emboli ([place])   . Mitral valve insufficiency     Past Surgical History  Procedure Laterality Date  . Insertion of icd  11/14/2009  . Bladder surgery    . Hernia repair  June 2016     Medications:  Outpatient Encounter Prescriptions as of 03/19/2016  Medication Sig  . allopurinol (ZYLOPRIM) 100 MG tablet Take 100 mg by mouth daily.  Marland Kitchen apixaban (ELIQUIS) 5 MG TABS tablet Take 5 mg by mouth 2 (two) times daily.  . bumetanide (BUMEX) 1 MG tablet Take 1.5 mg by mouth 2 (two) times daily.  . calcitRIOL (ROCALTROL) 0.25 MCG capsule Take 0.25 mcg by mouth 4 (four) times a week.  . ferrous sulfate 325 (65 FE) MG tablet Take 325 mg by mouth daily with breakfast.  . levothyroxine (SYNTHROID, LEVOTHROID) 88 MCG tablet Take 88 mcg by mouth daily before breakfast.  . lovastatin (MEVACOR) 20 MG tablet Take 20 mg by mouth at bedtime.  . metolazone (ZAROXOLYN) 2.5 MG tablet Take 1 tablet (2.5 mg total) by mouth 2 (two) times a week. Every Monday and Friday  . potassium chloride SA (K-DUR,KLOR-CON) 20 MEQ tablet Take 1 tablet Every Monday, Wednesday, and Friday with Metolazone.  . tamsulosin (FLOMAX) 0.4 MG CAPS capsule Take 0.4 mg by mouth daily after supper.  . tizanidine (ZANAFLEX) 2 MG capsule Take 1 capsule (2 mg total) by mouth at bedtime as needed for muscle spasms.   No facility-administered encounter medications on file as  of 03/19/2016.     Allergies:  Allergies  Allergen Reactions  . Ace Inhibitors Swelling    Family History: Family History  Problem Relation Age of Onset  . Stroke Father   . Heart disease Father   . Cancer Sister   . Lupus Daughter   . Hypertension Son   . Hypertension Daughter     Social History: Social History  Substance Use Topics  . Smoking status: Never Smoker   . Smokeless tobacco: Never Used  . Alcohol Use: No   Social History   Social History Narrative   Lives with wife, daughter  and 2 grandkids in a 2 story home.     Retired from [employer].     Education: high school.    Review of Systems:  CONSTITUTIONAL: No fevers, chills, night sweats, or weight loss.   EYES: No visual changes or eye pain ENT: No hearing changes.  No history of nose bleeds.   RESPIRATORY: No cough, wheezing and shortness of breath.   CARDIOVASCULAR: Negative for chest pain, and palpitations.   GI: Negative for abdominal discomfort, blood in stools or black stools.  No recent change in bowel habits.   GU:  No history of incontinence.   MUSCLOSKELETAL: No history of joint pain or swelling.  No myalgias.   SKIN: Negative for lesions, rash, and itching.   HEMATOLOGY/ONCOLOGY: Negative for prolonged bleeding, bruising easily, and swollen nodes.  No history of cancer.   ENDOCRINE: Negative for cold or heat intolerance, polydipsia or goiter.   PSYCH:  No depression or anxiety symptoms.   NEURO: As Above.   Vital Signs:  BP 110/64 mmHg  Pulse 68  Ht 5\' 10"  (1.778 m)  Wt 141 lb 3 oz (64.042 kg)  BMI 20.26 kg/m2   General Medical Exam:  General:  Well appearing, comfortable.   Eyes/ENT: see cranial nerve examination.   Neck: No masses appreciated.  Full range of motion without tenderness.  No carotid bruits. Respiratory:  Clear to auscultation, good air entry bilaterally.   Cardiac:  Regular rate and rhythm, no murmur.   Extremities:  No deformities, edema, or skin discoloration.  Skin:  No rashes or lesions.  Neurological Exam: MENTAL STATUS including orientation to time, place, person, recent and remote memory, attention span and concentration, language, and fund of knowledge is normal.  Speech is not dysarthric.  CRANIAL NERVES: II:  No visual field defects.  Unremarkable fundi.   III-IV-VI: Pupils equal round and reactive to light.  Normal conjugate, extra-ocular eye movements in all directions of gaze.  No nystagmus.  No ptosis.   V:  Normal facial sensation.   VII:  Normal facial  symmetry and movements.  No pathologic facial reflexes.  VIII:  Normal hearing and vestibular function.   IX-X:  Normal palatal movement.   XI:  Normal shoulder shrug and head rotation.   XII:  Normal tongue strength and range of motion, no deviation or fasciculation.  MOTOR:  Marked ABP atrophy bilaterally (R > L).  No fasciculations or abnormal movements.  No pronator drift.  Tone is normal.    Right Upper Extremity:    Left Upper Extremity:    Deltoid  5/5   Deltoid  5/5   Biceps  5/5   Biceps  5/5   Triceps  5/5   Triceps  5/5   Wrist extensors  5/5   Wrist extensors  5/5   Wrist flexors  5/5   Wrist flexors  5/5  Finger extensors  5/5   Finger extensors  5/5   Finger flexors  5/5   Finger flexors  5/5   Dorsal interossei  5/5   Dorsal interossei  5/5   Abductor pollicis  4/5   Abductor pollicis  4/5   Tone (Ashworth scale)  0  Tone (Ashworth scale)  0   Right Lower Extremity:    Left Lower Extremity:    Hip flexors  5/5   Hip flexors  5/5   Hip extensors  5/5   Hip extensors  5/5   Knee flexors  5/5   Knee flexors  5/5   Knee extensors  5/5   Knee extensors  5/5   Dorsiflexors  5/5   Dorsiflexors  5/5   Plantarflexors  5/5   Plantarflexors  5/5   Toe extensors  5/5   Toe extensors  5/5   Toe flexors  5/5   Toe flexors  5/5   Tone (Ashworth scale)  0+  Tone (Ashworth scale)  0+   MSRs:  Right                                                                 Left brachioradialis 3+  brachioradialis 3+  biceps 3+  biceps 3+  triceps 3+  triceps 3+  patellar 3+  patellar 3+  ankle jerk 1+  ankle jerk 1+  Hoffman no  Hoffman no  plantar response down  plantar response down   SENSORY: Pin prick reduced over the fingers bilaterally.   Normal and symmetric perception of light touch, vibration, and proprioception.   COORDINATION/GAIT: Normal finger-to- nose-finger.  Intact rapid alternating movements bilaterally.  Gait appears slightly-wide based and spastic appearing, assisted  with cane.    IMPRESSION: Mr. Goodlow is a 70 year-old gentleman referred for evaluation of bilateral hand paresthesias.  His symptoms are most consistent with severe bilateral carpal tunnel syndrome, moreso than a peripheral neuropathy.   NCS/EMG will be performed to better localize symptoms.  If there is no evidence of peripheral neuropathy, this may not help the case to diagnosis amyloidosis, but I will kindly l defer this to his cardiologist.    He also has brisk reflexes and mild spasticity of the legs causing gait instability, these findings are concerning for myelopathy.  CT cervical spine will be ordered.  He is unable to get MRI due to ICD/PPM.  During the first few minutes of my interview, patient became very lightheaded, lasting about 5 minutes, neurological exam was non-focal. Repeat BP checked and was low 90s/60s, he was given PO hydration and improved.   He was able to participate in the remainder of the exam and able to walk without difficulty.  I gave his daughter specific instructions to monitor him and if he develops lightheadedness again, then to take him to the nearest emergency department for evaluation and IV hydration.    Return to clinic in 3 months.   The duration of this appointment visit was 65 minutes of face-to-face time with the patient.  Greater than 50% of this time was spent in counseling, explanation of diagnosis, planning of further management, and coordination of care.   Thank you for allowing me to participate in patient's care.  If I can answer any  additional questions, I would be pleased to do so.    Sincerely,    Randell Teare K. Posey Pronto, DO

## 2016-03-30 ENCOUNTER — Encounter (HOSPITAL_COMMUNITY): Payer: Self-pay | Admitting: *Deleted

## 2016-03-30 ENCOUNTER — Ambulatory Visit (HOSPITAL_COMMUNITY)
Admission: RE | Admit: 2016-03-30 | Discharge: 2016-03-30 | Disposition: A | Discharge status: Home / Self Care | Payer: Medicare Other | Source: Ambulatory Visit | Attending: Internal Medicine | Admitting: Internal Medicine

## 2016-03-30 ENCOUNTER — Other Ambulatory Visit (HOSPITAL_COMMUNITY): Payer: Self-pay | Admitting: *Deleted

## 2016-03-30 VITALS — BP 94/68 | HR 78 | Wt 151.5 lb

## 2016-03-30 DIAGNOSIS — I4891 Unspecified atrial fibrillation: Secondary | ICD-10-CM | POA: Insufficient documentation

## 2016-03-30 DIAGNOSIS — Z79899 Other long term (current) drug therapy: Secondary | ICD-10-CM | POA: Diagnosis not present

## 2016-03-30 MED ORDER — METOLAZONE 2.5 MG PO TABS: 2.5000 mg | ORAL_TABLET | ORAL | Status: DC

## 2016-03-30 NOTE — Patient Instructions (Addendum)
INCREASE Metolazone to twice a week on Mondays and Fridays- only if approved by nephrology   Your physician recommends that you schedule a follow-up appointment in: 2 months

## 2016-03-30 NOTE — Progress Notes (Signed)
Patient ID: Kirk Perkins, male   DOB: Apr 04, 1946, 70 y.o.   MRN: [id number] Patient ID: Kirk Perkins, male   DOB: 11/19/1946, 70 y.o.   MRN: [id number]   ADVANCED HF CLINIC NOTE  Referring Physician: Dr. Patsi Sears ([place]) Primary Cardiologist: Dr. Chancy Milroy  HPI:  Kirk Perkins is a 70 y/o male with HTN, HL, hypertrophic/restrictive CM with recurrent syncope s/p St. Jude ICD, atrial tachycardia and PE referred by Dr. Chancy Milroy for further HF evaluation.  Kirk Perkins has appeared to carry the diagnosis of HOCM for over 15 years based on echo findings of LVH and mild SAM. Underwent ST Jude ICD placement in 2009 due to recurrent syncope.  We saw him for the first time in consult 02/05/16 as he was struggling more with volume overload and SOB. He has multiple admissions to the hospital for LE edema requiring IV diuresis. Echo in 10/16 reportedly showed EF 50-55% with moderate- severe (progressive) LVH with increased echogenicity raising suspicion for amyloid. Fat abdominal fat pas biopsy which was normal. At that time we added metolazone 2.5 2x/week with kcl 20 each time. SPEP/UPEP negative.Ferritin ok.  Repeat echo ordered EF 40-45% with very thick speckled myocardium highly suggestive of amyloid. Severe septal HK. RV moderately decreased function. LA 4.7 cm with moderate MR.  Also found to be back in AF since 12/16 on device interrogation.   Here is here for f/u: Feeling much better. He is thrilled that he has not been in the hospital in over 6 months. His nephrologist cut back metolazone to once a week due to AKI. His weight up is up 10 pounds.   Able to go to store without difficulty. No bleeding on Eliquis. SBP 95-115. No dizziness. He saw Dr. Caryl Comes in March and he is nearing point where he needs generator change for ICD. Dr. Caryl Comes suggested considering DC-CV.    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 Battery life 4 months left     Past Medical History  Diagnosis Date  . Syncope   . Obstructive cardiomyopathy ([place])   . Sick sinus syndrome ([place])   . HTN (hypertension)   . Hypothyroidism   . Benign prostatic hypertrophy   . Hyperlipidemia   . Atrial tachycardia ([place])   . Pulmonary emboli ([place])   . Mitral valve insufficiency     Current Outpatient Prescriptions  Medication Sig Dispense Refill  . allopurinol (ZYLOPRIM) 100 MG tablet Take 100 mg by mouth daily.    Marland Kitchen apixaban (ELIQUIS) 5 MG TABS tablet Take 5 mg by mouth 2 (two) times daily.    . bumetanide (BUMEX) 1 MG tablet Take 1.5 mg by mouth 2 (two) times daily.    . calcitRIOL (ROCALTROL) 0.25 MCG capsule Take 0.25 mcg by mouth 4 (four) times a week.    . ferrous sulfate 325 (65 FE) MG tablet Take 325 mg by mouth daily with breakfast.    . levothyroxine (SYNTHROID, LEVOTHROID) 88 MCG tablet Take 88 mcg by mouth daily before breakfast.    . lovastatin (MEVACOR) 20 MG tablet Take 20 mg by mouth at bedtime.    . metolazone (ZAROXOLYN) 2.5 MG tablet Take 2.5 mg by mouth once a week. Take every Friday    . potassium chloride SA (K-DUR,KLOR-CON) 20  MEQ tablet Take 20 mEq by mouth once a week. Take every Friday with metolazone    . tamsulosin (FLOMAX) 0.4 MG CAPS capsule Take 0.4 mg by mouth daily after supper.    . tizanidine (ZANAFLEX) 2 MG capsule Take 1 capsule (2 mg total) by mouth at bedtime as needed for muscle spasms. 30 capsule 5   No current facility-administered medications for this encounter.    Allergies  Allergen Reactions  . Ace Inhibitors Swelling      Social History   Social History  . Marital Status: Married    Spouse Name: N/A  . Number of Children: 3  . Years of Education: N/A   Occupational History  . retired    Social History Main Topics  . Smoking status: Never Smoker   . Smokeless tobacco:  Never Used  . Alcohol Use: No  . Drug Use: No  . Sexual Activity: Not on file   Other Topics Concern  . Not on file   Social History Narrative   Lives with wife, daughter and 2 grandkids in a 2 story home.     Retired from [employer].     Education: high school.      Family History  Problem Relation Age of Onset  . Stroke Father   . Heart disease Father   . Cancer Sister   . Lupus Daughter   . Hypertension Son   . Hypertension Daughter   Mother had HTN died from natural causes 2 sisters: no heart problems No family H/o cardiomyopathy  Filed Vitals:   03/30/16 1141  BP: 94/68  Pulse: 78  Weight: 151 lb 8 oz (68.72 kg)  SpO2: 98%    PHYSICAL EXAM: General:  Elderly male NAD. No respiratory difficulty HEENT: normal Neck: supple. JVP 10 with prominent CV waves Carotids 2+ bilat; no bruits. No lymphadenopathy or thryomegaly appreciated. Cor: PMI nondisplaced. Regular rate & rhythm. 2/6 TR  Lungs: clear Abdomen: soft, nontender, nondistended. Liver edge down slightly No bruits or masses. Good bowel sounds. Extremities: no cyanosis, clubbing, rash, 2+ chronic woody edema (compression stockings on)  Neuro: alert & oriented x 3, cranial nerves grossly intact. moves all 4 extremities w/o difficulty. Affect pleasant.  ECG: Atrial fibrillation with v-pacing normal voltage   ASSESSMENT & PLAN: 1. Chronic systolic/diastolic HF due to possible hypertrophic CM s/p St Jude ICD with R>>L heart failure symptoms     --Echo 02/05/16 EF 40-45% with very thick speckled myocardium highly suggestive of amyloid. Severe septal HK which also raises question of familial HCM. RV moderately decreased function. LA 4.7 cm with moderate MR.     --NYHA II-III. Volume status back up after cutting metolazone back to 1x/week. Will increase back to 2x/week. He will continue to follow with Nephrology as well.      -- In looking at his echo, I think that he has amyloid until proven otherwise. Given negative  SPEP/UPEP my concern is for TTR amyloidosis. This is confounded by negative fat pad biopsy and normal volts on ECG. I discussed this with Dr. Pilar Plate at [facility]. We wil defer biopsy and proceed with technitium pyrophosphate scan to assess for TTR amyloid.  2. Recurrent AF    --CHADSVASC = 4 This patients CHA2DS2-VASc Score and unadjusted Ischemic Stroke Rate (% per year) is equal to 4.8 % stroke rate/year from a score of 4     --He has been in AF since 12/16 by ICD interrogation. He is on apixaban. Discussed with Dr. Caryl Comes. Now  that biopsy on hold will plan DC-CV on apixaban. Suspect there will be significant risk of recurrence with LAE.  3. St Jude ICD    --Now at [facility]. Following with Dr. Caryl Comes. 4. HTN    --BP now on low side 5. H/o PE     --remains on apixaban 6. Bladder CA   Anam Bobby,MD 12:55 PM

## 2016-03-30 NOTE — Progress Notes (Signed)
Advanced Heart Failure Medication Review by a Pharmacist  Does the patient  feel that his/her medications are working for him/her?  yes  Has the patient been experiencing any side effects to the medications prescribed?  no  Does the patient measure his/her own blood pressure or blood glucose at home?  no   Does the patient have any problems obtaining medications due to transportation or finances?   no  Understanding of regimen: good Understanding of indications: good Potential of compliance: good Patient understands to avoid NSAIDs. Patient understands to avoid decongestants.  Issues to address at subsequent visits: None   Pharmacist comments:  Kirk Perkins is a pleasant 70 yo M presenting with his daughter and his medication bottles. He reports good compliance with his regimen but his daughter did state that his nephrologist reduced his metolazone to once weekly on Friday instead of twice weekly for worsening renal function. He did not have any other medication-related questions or concerns for me at this time.   [name], PharmD, BCPS, CPP Clinical Pharmacist Pager: [phone] Phone: [phone] 03/30/2016 12:08 PM      Time with patient: 10 minutes Preparation and documentation time: 2 minutes Total time: 12 minutes

## 2016-04-07 ENCOUNTER — Ambulatory Visit
Admission: RE | Admit: 2016-04-07 | Discharge: 2016-04-07 | Disposition: A | Discharge status: Home / Self Care | Payer: Medicare Other | Source: Ambulatory Visit | Attending: Neurology | Admitting: Neurology

## 2016-04-07 ENCOUNTER — Ambulatory Visit (INDEPENDENT_AMBULATORY_CARE_PROVIDER_SITE_OTHER): Payer: Medicare Other | Admitting: Neurology

## 2016-04-07 DIAGNOSIS — G959 Disease of spinal cord, unspecified: Secondary | ICD-10-CM

## 2016-04-07 DIAGNOSIS — G5603 Carpal tunnel syndrome, bilateral upper limbs: Secondary | ICD-10-CM

## 2016-04-07 DIAGNOSIS — G5622 Lesion of ulnar nerve, left upper limb: Secondary | ICD-10-CM

## 2016-04-07 DIAGNOSIS — M62838 Other muscle spasm: Secondary | ICD-10-CM

## 2016-04-07 DIAGNOSIS — M6250 Muscle wasting and atrophy, not elsewhere classified, unspecified site: Secondary | ICD-10-CM

## 2016-04-07 NOTE — Procedures (Signed)
[facility]  [place], [address]  [place], [place] [address] Tel: [phone] Fax:  [phone] Test Date:  04/07/2016  Patient: Kirk Perkins DOB: 23-Apr-1946 Physician: Narda Amber, DO  Sex: Male Height: 5\' 10"  Ref Phys: Narda Amber, DO  ID#: [id number] Temp: 32.3C Technician: Jerilynn Mages. Dean   Patient Complaints: This is a 70 year old gentleman referred for evaluation of bilateral hand paresthesias.  NCV & EMG Findings: Extensive electrodiagnostic testing of the right upper extremity and additional studies of the left shows: 1. Bilateral median sensory responses are absent. Left ulnar sensory response shows mildly prolonged latency with normal amplitude. Bilateral radial and the right ulnar sensory response is within normal limits. 2. Bilateral median motor responses are absent. Left ulnar motor response shows normal latency and amplitude however there is conduction velocity slowing across the elbow (A Elbow-B Elbow, 33 m/s).  right ulnar motor responses within normal limits. 3. Despite maximal activation, no motor unit recruitment is seen in bilateral abductor pollicis brevis muscles which is also severely atrophy. Sparse chronic motor axon loss changes are seen in the abductor digiti minimi muscle on the left only.  Impression: 1. Bilateral median neuropathy at or distal to the wrist, consistent with clinical diagnosis of carpal tunnel syndrome. Overall, these findings are very severe in degree electrically. 2. Left ulnar neuropathy across the elbow, purely demyelinating in type, and mild in degree electrically. 3. There is no evidence of generalized sensorimotor polyneuropathy or a cervical radiculopathy affecting the upper extremities.   ___________________________ Narda Amber, DO    Nerve Conduction Studies Anti Sensory Summary Table   Site NR Peak (ms) Norm Peak (ms) P-T Amp (V) Norm P-T Amp  Left Median Anti Sensory (2nd Digit)  32.3C  Wrist NR  <3.8  >10    Right Median Anti Sensory (2nd Digit)  32.5C  Wrist NR  <3.8  >10  Left Radial Anti Sensory (Base 1st Digit)  32.3C  Wrist    2.7 <2.8 11.4 >10  Right Radial Anti Sensory (Base 1st Digit)  32.3C  Wrist    2.7 <2.8 10.4 >10  Left Ulnar Anti Sensory (5th Digit)  32.3C  Wrist    3.3 <3.2 7.8 >5  Right Ulnar Anti Sensory (5th Digit)  32.3C  Wrist    3.2 <3.2 6.4 >5   Motor Summary Table   Site NR Onset (ms) Norm Onset (ms) O-P Amp (mV) Norm O-P Amp Site1 Site2 Delta-0 (ms) Dist (cm) Vel (m/s) Norm Vel (m/s)  Left Median Motor (Abd Poll Brev)  32.3C  Wrist NR  <4.0  >5 Elbow Wrist  31.0  >50  Elbow NR            Right Median Motor (Abd Poll Brev)  32.3C  Wrist NR  <4.0  >5 Elbow Wrist  26.0  >50  Elbow NR            Left Ulnar Motor (Abd Dig Minimi)  32.3C  Wrist    2.7 <3.1 8.7 >7 B Elbow Wrist 4.8 25.0 52 >50  B Elbow    7.5  7.3  A Elbow B Elbow 3.0 10.0 33 >50  A Elbow    10.5  6.3         Right Ulnar Motor (Abd Dig Minimi)  32.3C  Wrist    2.7 <3.1 9.9 >7 B Elbow Wrist 4.9 25.0 51 >50  B Elbow    7.6  9.2  A Elbow B Elbow 1.9 10.0 53 >50  A Elbow    9.5  8.5          EMG   Side Muscle Ins Act Fibs Psw Fasc Number Recrt Dur Dur. Amp Amp. Poly Poly. Comment  Left 1stDorInt Nml Nml Nml Nml Nml Nml Nml Nml Nml Nml Nml Nml N/A  Left Abd Poll Brev Nml Nml Nml Nml NE None - - - - - - ATR  Left Ext Indicis Nml Nml Nml Nml Nml Nml Nml Nml Nml Nml Nml Nml N/A  Left PronatorTeres Nml Nml Nml Nml Nml Nml Nml Nml Nml Nml Nml Nml N/A  Left Biceps Nml Nml Nml Nml Nml Nml Nml Nml Nml Nml Nml Nml N/A  Left Triceps Nml Nml Nml Nml Nml Nml Nml Nml Nml Nml Nml Nml N/A  Left Deltoid Nml Nml Nml Nml Nml Nml Nml Nml Nml Nml Nml Nml N/A  Right 1stDorInt Nml Nml Nml Nml Nml Nml Nml Nml Nml Nml Nml Nml N/A  Right Abd Poll Brev Nml Nml Nml Nml NE None - - - - - - ATR  Right Ext Indicis Nml Nml Nml Nml Nml Nml Nml Nml Nml Nml Nml Nml N/A  Right PronatorTeres Nml Nml Nml Nml Nml Nml Nml Nml  Nml Nml Nml Nml N/A  Right Biceps Nml Nml Nml Nml Nml Nml Nml Nml Nml Nml Nml Nml N/A  Right Triceps Nml Nml Nml Nml Nml Nml Nml Nml Nml Nml Nml Nml N/A  Right Deltoid Nml Nml Nml Nml Nml Nml Nml Nml Nml Nml Nml Nml N/A  Left ABD Dig Min Nml Nml Nml Nml 1- Mod-R Some 1+ Some 1+ Nml Nml N/A  Left FlexDigProf 4,5 Nml Nml Nml Nml Nml Nml Nml Nml Nml Nml Nml Nml N/A      Waveforms:

## 2016-04-08 ENCOUNTER — Telehealth: Payer: Self-pay | Admitting: Neurology

## 2016-04-08 NOTE — Telephone Encounter (Signed)
Patient that he has bilateral carpal tunnel syndrome, very severe, causing his hand weakness.  Unfortunately, with the severity of his symptoms, surgery would not help.  He has a very mild left ulnar neuropathy, likely asymptomatic. There is no evidence of neuropathy, which is what we were specifically looking for.  He also has significant age-related changes affecting his spine, disc bulges, and nerve impingement at several levels.  Findings explain myelopathic features on exam.  With the absence of weakness and radicular pain, continue to manage symptoms conservatively.    Kirk Edberg K. Posey Pronto, DO

## 2016-04-09 NOTE — Telephone Encounter (Signed)
Left message for patient to call me back. 

## 2016-04-10 NOTE — Telephone Encounter (Signed)
Patient and daughter given results and instructions.

## 2016-04-11 ENCOUNTER — Other Ambulatory Visit: Payer: Self-pay | Admitting: Internal Medicine

## 2016-04-11 DIAGNOSIS — I4819 Other persistent atrial fibrillation: Secondary | ICD-10-CM

## 2016-04-11 DIAGNOSIS — Z95810 Presence of automatic (implantable) cardiac defibrillator: Secondary | ICD-10-CM

## 2016-04-13 ENCOUNTER — Encounter (HOSPITAL_COMMUNITY): Payer: Self-pay | Admitting: *Deleted

## 2016-04-13 ENCOUNTER — Ambulatory Visit (HOSPITAL_COMMUNITY)
Admission: RE | Admit: 2016-04-13 | Discharge: 2016-04-13 | Disposition: A | Discharge status: Home / Self Care | Payer: Medicare Other | Source: Ambulatory Visit | Attending: Internal Medicine | Admitting: Internal Medicine

## 2016-04-13 ENCOUNTER — Encounter (HOSPITAL_COMMUNITY)
Admission: RE | Disposition: A | Discharge status: Home / Self Care | Payer: Self-pay | Source: Ambulatory Visit | Attending: Internal Medicine

## 2016-04-13 ENCOUNTER — Ambulatory Visit (HOSPITAL_COMMUNITY): Payer: Medicare Other | Admitting: Certified Registered Nurse Anesthetist

## 2016-04-13 ENCOUNTER — Ambulatory Visit (HOSPITAL_COMMUNITY): Admit: 2016-04-13 | Payer: Self-pay | Admitting: Internal Medicine

## 2016-04-13 DIAGNOSIS — Z4502 Encounter for adjustment and management of automatic implantable cardiac defibrillator: Secondary | ICD-10-CM | POA: Diagnosis not present

## 2016-04-13 DIAGNOSIS — I340 Nonrheumatic mitral (valve) insufficiency: Secondary | ICD-10-CM | POA: Insufficient documentation

## 2016-04-13 DIAGNOSIS — N400 Enlarged prostate without lower urinary tract symptoms: Secondary | ICD-10-CM | POA: Insufficient documentation

## 2016-04-13 DIAGNOSIS — E039 Hypothyroidism, unspecified: Secondary | ICD-10-CM | POA: Insufficient documentation

## 2016-04-13 DIAGNOSIS — E785 Hyperlipidemia, unspecified: Secondary | ICD-10-CM | POA: Insufficient documentation

## 2016-04-13 DIAGNOSIS — I4891 Unspecified atrial fibrillation: Secondary | ICD-10-CM

## 2016-04-13 DIAGNOSIS — I10 Essential (primary) hypertension: Secondary | ICD-10-CM | POA: Diagnosis not present

## 2016-04-13 DIAGNOSIS — I429 Cardiomyopathy, unspecified: Secondary | ICD-10-CM | POA: Diagnosis not present

## 2016-04-13 DIAGNOSIS — I422 Other hypertrophic cardiomyopathy: Secondary | ICD-10-CM

## 2016-04-13 DIAGNOSIS — Z95810 Presence of automatic (implantable) cardiac defibrillator: Secondary | ICD-10-CM

## 2016-04-13 DIAGNOSIS — Z86711 Personal history of pulmonary embolism: Secondary | ICD-10-CM | POA: Insufficient documentation

## 2016-04-13 DIAGNOSIS — I4819 Other persistent atrial fibrillation: Secondary | ICD-10-CM

## 2016-04-13 DIAGNOSIS — I495 Sick sinus syndrome: Secondary | ICD-10-CM | POA: Diagnosis not present

## 2016-04-13 HISTORY — PX: EP IMPLANTABLE DEVICE: SHX172B

## 2016-04-13 HISTORY — PX: ELECTROPHYSIOLOGIC STUDY: SHX172A

## 2016-04-13 LAB — PROTIME-INR
INR: 2.31 — ABNORMAL HIGH (ref 0.00–1.49)
Prothrombin Time: 25.1 seconds — ABNORMAL HIGH (ref 11.6–15.2)

## 2016-04-13 LAB — CBC WITH DIFFERENTIAL/PLATELET
Basophils Absolute: 0 10*3/uL (ref 0.0–0.1)
Basophils Relative: 1 %
Eosinophils Absolute: 0.1 10*3/uL (ref 0.0–0.7)
Eosinophils Relative: 2 %
HEMATOCRIT: 38.5 % — AB (ref 39.0–52.0)
HEMOGLOBIN: 13 g/dL (ref 13.0–17.0)
LYMPHS ABS: 0.9 10*3/uL (ref 0.7–4.0)
LYMPHS PCT: 19 %
MCH: 33.6 pg (ref 26.0–34.0)
MCHC: 33.8 g/dL (ref 30.0–36.0)
MCV: 99.5 fL (ref 78.0–100.0)
MONOS PCT: 14 %
Monocytes Absolute: 0.7 10*3/uL (ref 0.1–1.0)
NEUTROS PCT: 64 %
Neutro Abs: 3.2 10*3/uL (ref 1.7–7.7)
Platelets: 122 10*3/uL — ABNORMAL LOW (ref 150–400)
RBC: 3.87 MIL/uL — AB (ref 4.22–5.81)
RDW: 14.9 % (ref 11.5–15.5)
WBC: 4.9 10*3/uL (ref 4.0–10.5)

## 2016-04-13 LAB — POCT I-STAT, CHEM 8
BUN: 64 mg/dL — AB (ref 6–20)
CHLORIDE: 92 mmol/L — AB (ref 101–111)
CREATININE: 1.5 mg/dL — AB (ref 0.61–1.24)
Calcium, Ion: 1.14 mmol/L (ref 1.13–1.30)
GLUCOSE: 76 mg/dL (ref 65–99)
HEMATOCRIT: 47 % (ref 39.0–52.0)
HEMOGLOBIN: 16 g/dL (ref 13.0–17.0)
POTASSIUM: 3 mmol/L — AB (ref 3.5–5.1)
Sodium: 136 mmol/L (ref 135–145)
TCO2: 31 mmol/L (ref 0–100)

## 2016-04-13 SURGERY — CANCELLED PROCEDURE

## 2016-04-13 SURGERY — ICD/BIV ICD GENERATOR CHANGEOUT
Anesthesia: LOCAL

## 2016-04-13 MED ORDER — LIDOCAINE HCL (PF) 1 % IJ SOLN
INTRAMUSCULAR | Status: AC
  Filled 2016-04-13: qty 30

## 2016-04-13 MED ORDER — SODIUM CHLORIDE 0.9 % IR SOLN
Status: AC
  Filled 2016-04-13: qty 2

## 2016-04-13 MED ORDER — ONDANSETRON HCL 4 MG/2ML IJ SOLN: 4.0000 mg | Freq: Four times a day (QID) | INTRAMUSCULAR | Status: DC | PRN

## 2016-04-13 MED ORDER — MIDAZOLAM HCL 5 MG/5ML IJ SOLN
INTRAMUSCULAR | Status: AC
  Filled 2016-04-13: qty 5

## 2016-04-13 MED ORDER — MIDAZOLAM HCL 5 MG/5ML IJ SOLN
INTRAMUSCULAR | Status: DC | PRN
  Administered 2016-04-13: 2 mg via INTRAVENOUS
  Administered 2016-04-13: 1 mg via INTRAVENOUS

## 2016-04-13 MED ORDER — CEFAZOLIN SODIUM-DEXTROSE 2-4 GM/100ML-% IV SOLN
2.0000 g | INTRAVENOUS | Status: DC
  Administered 2016-04-13: 2 g via INTRAVENOUS

## 2016-04-13 MED ORDER — SODIUM CHLORIDE 0.9 % IV SOLN: INTRAVENOUS | Status: AC

## 2016-04-13 MED ORDER — FENTANYL CITRATE (PF) 100 MCG/2ML IJ SOLN
INTRAMUSCULAR | Status: DC | PRN
  Administered 2016-04-13: 25 ug via INTRAVENOUS
  Administered 2016-04-13: 50 ug via INTRAVENOUS

## 2016-04-13 MED ORDER — FENTANYL CITRATE (PF) 100 MCG/2ML IJ SOLN
INTRAMUSCULAR | Status: AC
  Filled 2016-04-13: qty 2

## 2016-04-13 MED ORDER — ACETAMINOPHEN 325 MG PO TABS: 325.0000 mg | ORAL_TABLET | ORAL | Status: DC | PRN

## 2016-04-13 MED ORDER — CEFAZOLIN SODIUM-DEXTROSE 2-4 GM/100ML-% IV SOLN
INTRAVENOUS | Status: AC
  Filled 2016-04-13: qty 100

## 2016-04-13 MED ORDER — SODIUM CHLORIDE 0.9 % IR SOLN
80.0000 mg | Status: DC
  Administered 2016-04-13: 80 mg
  Filled 2016-04-13: qty 2

## 2016-04-13 MED ORDER — SODIUM CHLORIDE 0.9 % IV SOLN: INTRAVENOUS | Status: DC

## 2016-04-13 MED ORDER — LIDOCAINE HCL (PF) 1 % IJ SOLN
INTRAMUSCULAR | Status: DC | PRN
  Administered 2016-04-13: 28 mL

## 2016-04-13 MED ORDER — POTASSIUM CHLORIDE 10 MEQ/100ML IV SOLN
10.0000 meq | INTRAVENOUS | Status: AC
  Administered 2016-04-13 (×2): 10 meq via INTRAVENOUS
  Filled 2016-04-13 (×2): qty 100

## 2016-04-13 SURGICAL SUPPLY — 5 items
CABLE SURGICAL S-101-97-12 (CABLE) ×2 IMPLANT
HEMOSTAT SURGICEL 2X4 FIBR (HEMOSTASIS) ×2 IMPLANT
ICD ELLIPSE DR CD2411-36C (ICD Generator) ×2 IMPLANT
PAD DEFIB LIFELINK (PAD) ×4 IMPLANT
TRAY PACEMAKER INSERTION (PACKS) ×2 IMPLANT

## 2016-04-13 NOTE — Discharge Instructions (Signed)
Pacemaker Battery Change, Care After Refer to this sheet in the next few weeks. These instructions provide you with information on caring for yourself after your procedure. Your health care provider may also give you more specific instructions. Your treatment has been planned according to current medical practices, but problems sometimes occur. Call your health care provider if you have any problems or questions after your procedure. WHAT TO EXPECT AFTER THE PROCEDURE After your procedure, it is typical to have the following sensations:  Soreness at the pacemaker site. HOME CARE INSTRUCTIONS  Dermabond will come off in 10-14 days and if not they will remove it at office visit   Keep the incision clean and dry do not get wet for 24 hours  For the first week after the replacement, avoid stretching motions that pull at the incision site, and avoid heavy exercise with the arm that is on the same side as the incision.  Take medicines only as directed by your health care provider.  Keep all follow-up visits as directed by your health care provider. SEEK MEDICAL CARE IF:   You have pain at the incision site that is not relieved by over-the-counter or prescription medicine.  There is drainage or pus from the incision site.  There is swelling larger than a lime at the incision site.  You develop red streaking that extends above or below the incision site.  You feel brief, intermittent palpitations, light-headedness, or any symptoms that you feel might be related to your heart. SEEK IMMEDIATE MEDICAL CARE IF:   You experience chest pain that is different than the pain at the pacemaker site.  You experience shortness of breath.  You have palpitations or irregular heartbeat.  You have light-headedness that does not go away quickly.  You faint.  You have pain that gets worse and is not relieved by medicine.   This information is not intended to replace advice given to you by your health  care provider. Make sure you discuss any questions you have with your health care provider.   Document Released: 09/06/2013 Document Revised: 12/07/2014 Document Reviewed: 09/06/2013 Elsevier Interactive Patient Education [employer].

## 2016-04-13 NOTE — Anesthesia Preprocedure Evaluation (Signed)
Anesthesia Evaluation    Airway Mallampati: II  TM Distance: >3 FB Neck ROM: Full    Dental  (+) Upper Dentures, Dental Advisory Given   Pulmonary           Cardiovascular hypertension,      Neuro/Psych    GI/Hepatic   Endo/Other    Renal/GU      Musculoskeletal   Abdominal   Peds  Hematology   Anesthesia Other Findings   Reproductive/Obstetrics                             Anesthesia Physical Anesthesia Plan Anesthesia Quick Evaluation

## 2016-04-13 NOTE — Interval H&P Note (Signed)
ICD Criteria  Current LVEF:55%. Within 12 months prior to implant: Yes   Heart failure history: Yes, Class II  Cardiomyopathy history: Yes, Non-Ischemic Cardiomyopathy.  Atrial Fibrillation/Atrial Flutter: Yes, Persistent (> 7 Days).  Ventricular tachycardia history: No.  Cardiac arrest history: No.  History of syndromes with risk of sudden death: Yes, Other  Previous ICD: Yes, Reason for ICD:  Primary prevention.  Current ICD indication: Primary  PPM indication: No.   Class I or II Bradycardia indication present: No  Beta Blocker therapy for 3 or more months: No, medical reason.  Ace Inhibitor/ARB therapy for 3 or more months: No, medical reason.  History and Physical Interval Note:  04/13/2016 1:31 PM  Kirk Perkins  has presented today for surgery, with the diagnosis of ERI  The various methods of treatment have been discussed with the patient and family. After consideration of risks, benefits and other options for treatment, the patient has consented to  Procedure(s): ICD [employer] (N/A) as a surgical intervention .  The patient's history has been reviewed, patient examined, no change in status, stable for surgery.  I have reviewed the patient's chart and labs.  Questions were answered to the patient's satisfaction.     Kirk Perkins

## 2016-04-13 NOTE — H&P (Signed)
       Patient Care Team: Cathie Olden, MD as PCP - General (Family Medicine)   HPI  Kirk Perkins is a 70 y.o. male  With previously implanted ICD now at [facility] Also in afib for cardioversion  He has a history of hypertrophic/restrictive cardiomyopathy with syncope. Ejection fraction 10/16 demonstrated EF 50%. Wall thicknesses were described as 25 mm posteriorly and 17 mm and the septum. Left atrial enlargement 47. Index volumes are not available qualitatively moderate-severe  He has heart failure with right than left with peripheral edema which is markedly improved on the new diuretic regime.  There are issues regarding amyloid; Echogenicity raises a question about amyloid. Fat pad biopsies were negative. Biopsy apparently is anticipated. Interestingly voltages are normal. He also has a peripheral neuropathy so he cannot button things.  He's a history of atrial fibrillation and he has been persistently out of rhythm since 12/16.     Past Medical History  Diagnosis Date  . Syncope   . Obstructive cardiomyopathy ([place])   . Sick sinus syndrome ([place])   . HTN (hypertension)   . Hypothyroidism   . Benign prostatic hypertrophy   . Hyperlipidemia   . Atrial tachycardia ([place])   . Pulmonary emboli ([place])   . Mitral valve insufficiency     Past Surgical History  Procedure Laterality Date  . Insertion of icd  11/14/2009  . Bladder surgery    . Hernia repair  June 2016    Current Facility-Administered Medications  Medication Dose Route Frequency Provider Last Rate Last Dose  . 0.9 %  sodium chloride infusion   Intravenous Continuous Deboraha Sprang, MD      . ceFAZolin (ANCEF) IVPB 2g/100 mL premix  2 g Intravenous On Call Deboraha Sprang, MD      . gentamicin (GARAMYCIN) 80 mg in sodium chloride irrigation 0.9 % 500 mL irrigation  80 mg Irrigation On Call Deboraha Sprang, MD      . potassium chloride 10 mEq in 100 mL IVPB  10 mEq Intravenous Q1 Hr x 2 Deboraha Sprang,  MD        Allergies  Allergen Reactions  . Ace Inhibitors Swelling      Review of Systems negative except from HPI and PMH  Physical Exam BP 104/68 mmHg  Pulse 71  Temp(Src) 97.6 F (36.4 C) (Oral)  Resp 11  Ht 5\' 10"  (1.778 m)  Wt 146 lb (66.225 kg)  BMI 20.95 kg/m2  SpO2 100% Well developed and well nourished in no acute distress HENT normal E scleral and icterus clear Neck Supple JVP flat; carotids brisk and full Clear to ausculation Device pocket well healed; without hematoma or erythema.  There is no tethering Regular rate and rhythm, no murmurs gallops or rub Soft with active bowel sounds No clubbing cyanosis  Edema Alert and oriented, grossly normal motor and sensory function Skin Warm and Dry    Assessment and  Plan  HCM  AFIB  ICD at ERI   We have reviewed the benefits and risks of generator replacement.  These include but are not limited to lead fracture and infection.  The patient understands, agrees and is willing to proceed.    Will replete K  For DCCV    will

## 2016-04-14 ENCOUNTER — Encounter (HOSPITAL_COMMUNITY): Payer: Self-pay | Admitting: Internal Medicine

## 2016-04-14 MED FILL — Lidocaine HCl Local Preservative Free (PF) Inj 1%: INTRAMUSCULAR | Qty: 30 | Status: AC

## 2016-04-14 NOTE — Progress Notes (Signed)
Patient in Endo unit. Dr. Caryl Comes at bedside to see patient and requested that patient have his cardioversion in the cath lab since he was having a generator change as well. Patient's potassium found to be 3.0 and orders placed per Dr. Caryl Comes to replace with IV potassium over 2 hours. Received potassium and started the first bag. Transferred patient to cath lab per their request. Patient A&O x 4, VSS.

## 2016-04-16 ENCOUNTER — Ambulatory Visit (INDEPENDENT_AMBULATORY_CARE_PROVIDER_SITE_OTHER): Payer: Medicare Other | Admitting: *Deleted

## 2016-04-16 ENCOUNTER — Encounter: Payer: Self-pay | Admitting: Internal Medicine

## 2016-04-16 DIAGNOSIS — I480 Paroxysmal atrial fibrillation: Secondary | ICD-10-CM | POA: Diagnosis not present

## 2016-04-16 DIAGNOSIS — I5022 Chronic systolic (congestive) heart failure: Secondary | ICD-10-CM | POA: Diagnosis not present

## 2016-04-17 LAB — CUP PACEART INCLINIC DEVICE CHECK
Battery Remaining Longevity: 57.6
Date Time Interrogation Session: 20170518200544
HighPow Impedance: 52.875
Implantable Lead Implant Date: 20101217
Implantable Lead Location: 753859
Lead Channel Impedance Value: 337.5 Ohm
Lead Channel Impedance Value: 375 Ohm
Lead Channel Pacing Threshold Amplitude: 1 V
Lead Channel Pacing Threshold Amplitude: 1.75 V
Lead Channel Pacing Threshold Amplitude: 1.75 V
Lead Channel Pacing Threshold Pulse Width: 0.5 ms
Lead Channel Pacing Threshold Pulse Width: 1 ms
Lead Channel Setting Pacing Amplitude: 2.5 V
Lead Channel Setting Pacing Amplitude: 3.5 V
Lead Channel Setting Sensing Sensitivity: 0.5 mV
MDC IDC LEAD IMPLANT DT: 20101217
MDC IDC LEAD LOCATION: 753860
MDC IDC MSMT LEADCHNL RA PACING THRESHOLD PULSEWIDTH: 1 ms
MDC IDC MSMT LEADCHNL RA SENSING INTR AMPL: 0.8 mV
MDC IDC MSMT LEADCHNL RV PACING THRESHOLD AMPLITUDE: 1 V
MDC IDC MSMT LEADCHNL RV PACING THRESHOLD PULSEWIDTH: 0.5 ms
MDC IDC MSMT LEADCHNL RV SENSING INTR AMPL: 9.2 mV
MDC IDC PG SERIAL: 7364322
MDC IDC SET LEADCHNL RV PACING PULSEWIDTH: 0.5 ms
MDC IDC STAT BRADY RA PERCENT PACED: 44 %
MDC IDC STAT BRADY RV PERCENT PACED: 94 %

## 2016-04-17 NOTE — Progress Notes (Signed)
Wound check appointment. Dermabond intact. Incision edges approximated. Large hematoma noted over L chest with red ecchymosis. Dr.Klein assessed wound and recommended that pressure be applied in the office for 10 minutes prior to applying pressure dressing. Dr. Caryl Comes also recommended that patient hold his Eliquis until his f/u appt scheduled for 5/22. Rx was also given for Vicodin per SK.  ICD checked in office. Normal device function. Thresholds, sensing, and impedances consistent with implant measurements. Device programmed at appropriate safety margins. Histogram distribution appropriate for patient and level of activity. (2) mode switches (<1%)--both episodes were 12 seconds in duration, Max A 169--AT. No ventricular arrhythmias noted. Patient will follow up with the Device clinic on 5/22.

## 2016-04-20 ENCOUNTER — Ambulatory Visit: Payer: Medicare Other | Admitting: *Deleted

## 2016-04-20 DIAGNOSIS — Z95810 Presence of automatic (implantable) cardiac defibrillator: Secondary | ICD-10-CM

## 2016-04-20 NOTE — Progress Notes (Signed)
Wound check appointment. Dermabond intact. Incision edges approximated. Large hematoma noted over L chest with red ecchymosis. AS saw in clinic. Pressure dressing re-applied, continue to hold Eliquis per AS. ROV in device clinic 5/26.

## 2016-04-23 ENCOUNTER — Ambulatory Visit: Payer: Medicare Other

## 2016-04-24 ENCOUNTER — Ambulatory Visit: Payer: Medicare Other | Admitting: *Deleted

## 2016-04-24 DIAGNOSIS — T148XXA Other injury of unspecified body region, initial encounter: Secondary | ICD-10-CM

## 2016-04-24 NOTE — Progress Notes (Signed)
Pt still with significant hematoma, but softer than earlier this week.  Per Dr Olin Pia plan, patient has been off Eliquis since 04/16/16.  He underwent DCCV at time of gen change 04/13/16. Discussed with patient and daughter today. will reapply pressure dressing and continue off of Eliquis for now.  The patient understands concerns of being off [place] post DCCV but is very adamant about not wanting repeat procedure.  I am concerned if we restart Eliquis that he will require pocket evacuation.  Pt to come back on Tuesday 5/30 to be seen by Dr Caryl Comes.    Chanetta Marshall, NP 04/24/2016 2:23 PM

## 2016-04-28 ENCOUNTER — Ambulatory Visit (INDEPENDENT_AMBULATORY_CARE_PROVIDER_SITE_OTHER): Payer: Medicare Other | Admitting: Internal Medicine

## 2016-04-28 ENCOUNTER — Encounter: Payer: Self-pay | Admitting: Internal Medicine

## 2016-04-28 ENCOUNTER — Other Ambulatory Visit: Payer: Self-pay

## 2016-04-28 VITALS — BP 90/64 | HR 72 | Ht 70.0 in | Wt 143.6 lb

## 2016-04-28 DIAGNOSIS — I481 Persistent atrial fibrillation: Secondary | ICD-10-CM

## 2016-04-28 DIAGNOSIS — I4819 Other persistent atrial fibrillation: Secondary | ICD-10-CM

## 2016-04-28 DIAGNOSIS — I5042 Chronic combined systolic (congestive) and diastolic (congestive) heart failure: Secondary | ICD-10-CM

## 2016-04-28 DIAGNOSIS — Z95810 Presence of automatic (implantable) cardiac defibrillator: Secondary | ICD-10-CM

## 2016-04-28 NOTE — Patient Instructions (Addendum)
Medication Instructions: - Your physician recommends that you continue on your current medications as directed. Please refer to the Current Medication list given to you today.  Labwork: - none  Procedures/Testing: - none  Follow-Up: - Your physician recommends that you schedule a follow-up appointment in: 1 week with Dr. Caryl Comes- wound check- Monday 05/04/16 @ 10:45 am  Any Additional Special Instructions Will Be Listed Below (If Applicable).     If you need a refill on your cardiac medications before your next appointment, please call your pharmacy.

## 2016-04-28 NOTE — Progress Notes (Signed)
Looking at hematoma Still about the same size apparently but now is soft No discoloration at this point

## 2016-05-04 ENCOUNTER — Encounter: Payer: Self-pay | Admitting: Internal Medicine

## 2016-05-04 ENCOUNTER — Ambulatory Visit (INDEPENDENT_AMBULATORY_CARE_PROVIDER_SITE_OTHER): Payer: Self-pay | Admitting: Internal Medicine

## 2016-05-04 VITALS — BP 116/62 | Ht 70.0 in | Wt 124.4 lb

## 2016-05-04 DIAGNOSIS — Z95810 Presence of automatic (implantable) cardiac defibrillator: Secondary | ICD-10-CM

## 2016-05-04 DIAGNOSIS — I5042 Chronic combined systolic (congestive) and diastolic (congestive) heart failure: Secondary | ICD-10-CM

## 2016-05-04 DIAGNOSIS — T148XXA Other injury of unspecified body region, initial encounter: Secondary | ICD-10-CM

## 2016-05-04 DIAGNOSIS — T148 Other injury of unspecified body region: Secondary | ICD-10-CM

## 2016-05-04 DIAGNOSIS — I4819 Other persistent atrial fibrillation: Secondary | ICD-10-CM

## 2016-05-04 DIAGNOSIS — I481 Persistent atrial fibrillation: Secondary | ICD-10-CM

## 2016-05-04 NOTE — Progress Notes (Signed)
Hematoma is not appreciably different in size. It remained soft. There remains no superficial skin discoloration. We'll see him again in 2 weeks  At this juncture we are about 3 or 4 weeks post cardioversion. We will keep him off of his blood thinner for right now

## 2016-05-04 NOTE — Patient Instructions (Signed)
Medication Instructions: - Your physician recommends that you continue on your current medications as directed. Please refer to the Current Medication list given to you today.  Labwork: - none  Procedures/Testing: - none  Follow-Up: - Your physician recommends that you schedule a follow-up appointment in: 2 weeks with Dr. Caryl Comes (wound check only).- Wednesday 05/20/16 @ 4:00 pm  Any Additional Special Instructions Will Be Listed Below (If Applicable).     If you need a refill on your cardiac medications before your next appointment, please call your pharmacy.

## 2016-05-17 ENCOUNTER — Observation Stay (HOSPITAL_COMMUNITY)
Admission: EM | Admit: 2016-05-17 | Discharge: 2016-05-19 | Disposition: A | Discharge status: Home Health | Payer: Medicare Other | Source: Other Acute Inpatient Hospital | Attending: Internal Medicine | Admitting: Internal Medicine

## 2016-05-17 DIAGNOSIS — I10 Essential (primary) hypertension: Secondary | ICD-10-CM | POA: Diagnosis not present

## 2016-05-17 DIAGNOSIS — I422 Other hypertrophic cardiomyopathy: Secondary | ICD-10-CM | POA: Diagnosis not present

## 2016-05-17 DIAGNOSIS — L7632 Postprocedural hematoma of skin and subcutaneous tissue following other procedure: Secondary | ICD-10-CM | POA: Insufficient documentation

## 2016-05-17 DIAGNOSIS — I481 Persistent atrial fibrillation: Secondary | ICD-10-CM | POA: Insufficient documentation

## 2016-05-17 DIAGNOSIS — R55 Syncope and collapse: Principal | ICD-10-CM | POA: Diagnosis present

## 2016-05-17 DIAGNOSIS — R7989 Other specified abnormal findings of blood chemistry: Secondary | ICD-10-CM | POA: Diagnosis present

## 2016-05-17 DIAGNOSIS — Y831 Surgical operation with implant of artificial internal device as the cause of abnormal reaction of the patient, or of later complication, without mention of misadventure at the time of the procedure: Secondary | ICD-10-CM | POA: Insufficient documentation

## 2016-05-17 DIAGNOSIS — E039 Hypothyroidism, unspecified: Secondary | ICD-10-CM | POA: Insufficient documentation

## 2016-05-17 DIAGNOSIS — I421 Obstructive hypertrophic cardiomyopathy: Secondary | ICD-10-CM | POA: Insufficient documentation

## 2016-05-17 DIAGNOSIS — I951 Orthostatic hypotension: Secondary | ICD-10-CM | POA: Diagnosis present

## 2016-05-17 DIAGNOSIS — Z79899 Other long term (current) drug therapy: Secondary | ICD-10-CM | POA: Diagnosis not present

## 2016-05-17 DIAGNOSIS — I495 Sick sinus syndrome: Secondary | ICD-10-CM | POA: Diagnosis not present

## 2016-05-17 DIAGNOSIS — I425 Other restrictive cardiomyopathy: Secondary | ICD-10-CM | POA: Insufficient documentation

## 2016-05-17 DIAGNOSIS — E876 Hypokalemia: Secondary | ICD-10-CM | POA: Diagnosis not present

## 2016-05-17 DIAGNOSIS — E785 Hyperlipidemia, unspecified: Secondary | ICD-10-CM | POA: Insufficient documentation

## 2016-05-17 DIAGNOSIS — R262 Difficulty in walking, not elsewhere classified: Secondary | ICD-10-CM | POA: Diagnosis not present

## 2016-05-17 DIAGNOSIS — R778 Other specified abnormalities of plasma proteins: Secondary | ICD-10-CM | POA: Diagnosis present

## 2016-05-17 DIAGNOSIS — I959 Hypotension, unspecified: Secondary | ICD-10-CM

## 2016-05-17 DIAGNOSIS — N400 Enlarged prostate without lower urinary tract symptoms: Secondary | ICD-10-CM | POA: Diagnosis not present

## 2016-05-17 DIAGNOSIS — Z95810 Presence of automatic (implantable) cardiac defibrillator: Secondary | ICD-10-CM | POA: Diagnosis present

## 2016-05-17 DIAGNOSIS — Z86711 Personal history of pulmonary embolism: Secondary | ICD-10-CM | POA: Diagnosis not present

## 2016-05-17 DIAGNOSIS — I440 Atrioventricular block, first degree: Secondary | ICD-10-CM | POA: Insufficient documentation

## 2016-05-17 HISTORY — DX: Hypotension, unspecified: I95.9

## 2016-05-17 HISTORY — DX: Carpal tunnel syndrome, unspecified upper limb: G56.00

## 2016-05-17 HISTORY — DX: Presence of automatic (implantable) cardiac defibrillator: Z95.810

## 2016-05-17 LAB — POTASSIUM: Potassium: 3.2 mmol/L — ABNORMAL LOW (ref 3.5–5.1)

## 2016-05-17 LAB — TROPONIN I: Troponin I: 0.43 ng/mL — ABNORMAL HIGH (ref <0.031)

## 2016-05-17 MED ORDER — METOLAZONE 2.5 MG PO TABS: 2.5000 mg | ORAL_TABLET | ORAL | Status: DC

## 2016-05-17 MED ORDER — LEVOTHYROXINE SODIUM 88 MCG PO TABS
88.0000 ug | ORAL_TABLET | Freq: Every day | ORAL | Status: DC
  Administered 2016-05-18 – 2016-05-19 (×2): 88 ug via ORAL
  Filled 2016-05-17 (×2): qty 1

## 2016-05-17 MED ORDER — PRAVASTATIN SODIUM 20 MG PO TABS
20.0000 mg | ORAL_TABLET | Freq: Every day | ORAL | Status: DC
  Administered 2016-05-18: 20 mg via ORAL
  Filled 2016-05-17: qty 1

## 2016-05-17 MED ORDER — CALCITRIOL 0.25 MCG PO CAPS
0.2500 ug | ORAL_CAPSULE | ORAL | Status: DC
  Administered 2016-05-18 – 2016-05-19 (×2): 0.25 ug via ORAL
  Filled 2016-05-17 (×2): qty 1

## 2016-05-17 MED ORDER — TAMSULOSIN HCL 0.4 MG PO CAPS
0.4000 mg | ORAL_CAPSULE | Freq: Every day | ORAL | Status: DC
  Administered 2016-05-17 – 2016-05-18 (×2): 0.4 mg via ORAL
  Filled 2016-05-17 (×2): qty 1

## 2016-05-17 MED ORDER — POTASSIUM CHLORIDE CRYS ER 20 MEQ PO TBCR: 20.0000 meq | EXTENDED_RELEASE_TABLET | ORAL | Status: DC

## 2016-05-17 MED ORDER — ALLOPURINOL 100 MG PO TABS
100.0000 mg | ORAL_TABLET | Freq: Every day | ORAL | Status: DC
  Administered 2016-05-18 – 2016-05-19 (×2): 100 mg via ORAL
  Filled 2016-05-17 (×3): qty 1

## 2016-05-17 MED ORDER — BUMETANIDE 1 MG PO TABS
1.5000 mg | ORAL_TABLET | Freq: Two times a day (BID) | ORAL | Status: DC
  Administered 2016-05-18: 1.5 mg via ORAL
  Filled 2016-05-17 (×2): qty 1

## 2016-05-17 MED ORDER — FERROUS SULFATE 325 (65 FE) MG PO TABS
325.0000 mg | ORAL_TABLET | Freq: Every day | ORAL | Status: DC
  Administered 2016-05-18 – 2016-05-19 (×2): 325 mg via ORAL
  Filled 2016-05-17 (×2): qty 1

## 2016-05-17 MED ORDER — TIZANIDINE HCL 2 MG PO TABS
2.0000 mg | ORAL_TABLET | Freq: Every evening | ORAL | Status: DC | PRN
  Filled 2016-05-17: qty 1

## 2016-05-17 MED ORDER — SODIUM CHLORIDE 0.9% FLUSH
3.0000 mL | Freq: Two times a day (BID) | INTRAVENOUS | Status: DC
  Administered 2016-05-17 – 2016-05-18 (×3): 3 mL via INTRAVENOUS

## 2016-05-17 NOTE — H&P (Signed)
History and Physical    Diane Bundren [id number] DOB: 1946-05-18 DOA: 05/17/2016   PCP: Cathie Olden, MD Chief Complaint: No chief complaint on file.   HPI: Kirk Perkins is a 70 y.o. male with medical history significant of HOCM, syncope, SSS, A.Fib.  Patient had pacer / ICD generator changed out last month, also had cardioversion for A.Fib at that time.  Post op course has been complicated by a large hematoma at the pacemaker site on his chest for which they currently have the patient holding blood thinners for, otherwise he had been doing well.  Today as he was trying to stand up from a chair, he had a syncopal episode.  He woke back up on his own, paramedics arrived and took him to ER.  He was then transferred here to [facility].  ED Course: Orthostatics negative in ED, given 1L IVF in ED.  Trop 0.42, EKG shows paced rhythm, he has no chest pain or other symptoms at this time.  K 2.8, given 17meq IV and 29meq PO potassium.  HGB 13.3.  [facility] transferred to [facility].  Review of Systems: As per HPI otherwise 10 point review of systems negative.    Past Medical History  Diagnosis Date  . Syncope   . Obstructive cardiomyopathy ([place])   . Sick sinus syndrome ([place])   . HTN (hypertension)   . Hypothyroidism   . Benign prostatic hypertrophy   . Hyperlipidemia   . Atrial tachycardia ([place])   . Pulmonary emboli ([place])   . Mitral valve insufficiency     Past Surgical History  Procedure Laterality Date  . Insertion of icd  11/14/2009  . Bladder surgery    . Hernia repair  June 2016  . Ep implantable device N/A 04/13/2016    Procedure: ICD Generator Changeout;  Surgeon: Deboraha Sprang, MD;  Location: [place] [facility];  Service: Cardiovascular;  Laterality: N/A;  . Electrophysiologic study N/A 04/13/2016    Procedure: Cardioversion;  Surgeon: Deboraha Sprang, MD;  Location: [place] [facility];  Service: Cardiovascular;  Laterality: N/A;     reports that he has never smoked. He has never  used smokeless tobacco. He reports that he does not drink alcohol or use illicit drugs.  Allergies  Allergen Reactions  . Ace Inhibitors Swelling    Family History  Problem Relation Age of Onset  . Stroke Father   . Heart disease Father   . Cancer Sister   . Lupus Daughter   . Hypertension Son   . Hypertension Daughter       Prior to Admission medications   Medication Sig Start Date End Date Taking? Authorizing Provider  allopurinol (ZYLOPRIM) 100 MG tablet Take 100 mg by mouth daily.    Historical Provider, MD  bumetanide (BUMEX) 1 MG tablet Take 1.5 mg by mouth 2 (two) times daily.    Historical Provider, MD  calcitRIOL (ROCALTROL) 0.25 MCG capsule Take 0.25 mcg by mouth 4 (four) times a week.    Historical Provider, MD  ferrous sulfate 325 (65 FE) MG tablet Take 325 mg by mouth daily with breakfast.    Historical Provider, MD  levothyroxine (SYNTHROID, LEVOTHROID) 88 MCG tablet Take 88 mcg by mouth daily before breakfast.    Historical Provider, MD  lovastatin (MEVACOR) 20 MG tablet Take 20 mg by mouth at bedtime.    Historical Provider, MD  metolazone (ZAROXOLYN) 2.5 MG tablet Take 1 tablet (2.5 mg total) by mouth 2 (two) times a week. Take every Monday  and Friday 03/30/16   Jolaine Artist, MD  potassium chloride SA (K-DUR,KLOR-CON) 20 MEQ tablet Take 20 mEq by mouth once a week. Take every Friday with metolazone    Historical Provider, MD  tamsulosin (FLOMAX) 0.4 MG CAPS capsule Take 0.4 mg by mouth daily after supper.    Historical Provider, MD  tizanidine (ZANAFLEX) 2 MG capsule Take 1 capsule (2 mg total) by mouth at bedtime as needed for muscle spasms. 03/19/16   Alda Berthold, DO    Physical Exam: Filed Vitals:   05/17/16 0640 05/17/16 1930  BP: 110/84 101/66  Pulse: 72 72  Temp:  97.5 F (36.4 C)  TempSrc:  Oral  Resp: 17 18  SpO2:  100%      Constitutional: NAD, calm, comfortable Eyes: PERRL, lids and conjunctivae normal ENMT: Mucous membranes are moist.  Posterior pharynx clear of any exudate or lesions.Normal dentition.  Neck: normal, supple, no masses, no thyromegaly Respiratory: clear to auscultation bilaterally, no wheezing, no crackles. Normal respiratory effort. No accessory muscle use.  Cardiovascular: Regular rate and rhythm, no murmurs / rubs / gallops. No extremity edema. 2+ pedal pulses. No carotid bruits.  Abdomen: no tenderness, no masses palpated. No hepatosplenomegaly. Bowel sounds positive.  Musculoskeletal: no clubbing / cyanosis. No joint deformity upper and lower extremities. Good ROM, no contractures. Normal muscle tone.  Skin: no rashes, lesions, ulcers. No induration Neurologic: CN 2-12 grossly intact. Sensation intact, DTR normal. Strength 5/5 in all 4.  Psychiatric: Normal judgment and insight. Alert and oriented x 3. Normal mood.    Labs on Admission: I have personally reviewed following labs and imaging studies  CBC: No results for input(s): WBC, NEUTROABS, HGB, HCT, MCV, PLT in the last 168 hours. Basic Metabolic Panel: No results for input(s): NA, K, CL, CO2, GLUCOSE, BUN, CREATININE, CALCIUM, MG, PHOS in the last 168 hours. GFR: CrCl cannot be calculated (Patient has no serum creatinine result on file.). Liver Function Tests: No results for input(s): AST, ALT, ALKPHOS, BILITOT, PROT, ALBUMIN in the last 168 hours. No results for input(s): LIPASE, AMYLASE in the last 168 hours. No results for input(s): AMMONIA in the last 168 hours. Coagulation Profile: No results for input(s): INR, PROTIME in the last 168 hours. Cardiac Enzymes: No results for input(s): CKTOTAL, CKMB, CKMBINDEX, TROPONINI in the last 168 hours. BNP (last 3 results) No results for input(s): PROBNP in the last 8760 hours. HbA1C: No results for input(s): HGBA1C in the last 72 hours. CBG: No results for input(s): GLUCAP in the last 168 hours. Lipid Profile: No results for input(s): CHOL, HDL, LDLCALC, TRIG, CHOLHDL, LDLDIRECT in the last 72  hours. Thyroid Function Tests: No results for input(s): TSH, T4TOTAL, FREET4, T3FREE, THYROIDAB in the last 72 hours. Anemia Panel: No results for input(s): VITAMINB12, FOLATE, FERRITIN, TIBC, IRON, RETICCTPCT in the last 72 hours. Urine analysis: No results found for: COLORURINE, APPEARANCEUR, LABSPEC, PHURINE, GLUCOSEU, HGBUR, BILIRUBINUR, KETONESUR, PROTEINUR, UROBILINOGEN, NITRITE, LEUKOCYTESUR Sepsis Labs: @LABRCNTIP (procalcitonin:4,lacticidven:4) )No results found for this or any previous visit (from the past 240 hour(s)).   Radiological Exams on Admission: No results found.  EKG: Independently reviewed.  Assessment/Plan Principal Problem:   Syncope Active Problems:   Hypertrophic cardiomyopathy (HCC)   Automatic implantable cardioverter-defibrillator in situ   Syncope -  Re check orthostatics  Pacemaker / ICD interrogation ordered  Syncope pathway  Tele monitor  Serial trops ordered, but he is asymptomatic at this time  Pacer / AICD -  See above  Holding anticoagulants  due to hematoma  CHF / HOCM -  Continue home meds and diuretics for now, holding tonights Bumex dose though.  Hypokalemia  Repeat potassium ordered   DVT prophylaxis: SCDs Code Status: Full Family Communication: No family in room Consults called: None Admission status: Admit to obs   [place], [place] [facility] Pager [phone] from 7PM-7AM  If 7AM-7PM, please contact the day physician for the patient [email or web address] Password [email or web address]  05/17/2016, 8:05 PM

## 2016-05-17 NOTE — Progress Notes (Signed)
Pt arrived to unit via EMS.  Pt placed on telemetry and CCMD notified.  Pt stable upon arrival.  No s/s of distress.  Outreach made to hospitalist team for further orders.  Await orders.

## 2016-05-18 DIAGNOSIS — R7989 Other specified abnormal findings of blood chemistry: Secondary | ICD-10-CM

## 2016-05-18 DIAGNOSIS — I951 Orthostatic hypotension: Secondary | ICD-10-CM | POA: Diagnosis not present

## 2016-05-18 DIAGNOSIS — E876 Hypokalemia: Secondary | ICD-10-CM | POA: Diagnosis present

## 2016-05-18 DIAGNOSIS — I422 Other hypertrophic cardiomyopathy: Secondary | ICD-10-CM | POA: Diagnosis not present

## 2016-05-18 DIAGNOSIS — Z95810 Presence of automatic (implantable) cardiac defibrillator: Secondary | ICD-10-CM | POA: Diagnosis not present

## 2016-05-18 DIAGNOSIS — R778 Other specified abnormalities of plasma proteins: Secondary | ICD-10-CM | POA: Diagnosis present

## 2016-05-18 DIAGNOSIS — R55 Syncope and collapse: Secondary | ICD-10-CM

## 2016-05-18 LAB — BASIC METABOLIC PANEL
ANION GAP: 10 (ref 5–15)
BUN: 50 mg/dL — ABNORMAL HIGH (ref 6–20)
CALCIUM: 9.3 mg/dL (ref 8.9–10.3)
CO2: 28 mmol/L (ref 22–32)
Chloride: 95 mmol/L — ABNORMAL LOW (ref 101–111)
Creatinine, Ser: 1.19 mg/dL (ref 0.61–1.24)
GLUCOSE: 73 mg/dL (ref 65–99)
POTASSIUM: 3.3 mmol/L — AB (ref 3.5–5.1)
Sodium: 133 mmol/L — ABNORMAL LOW (ref 135–145)

## 2016-05-18 LAB — TROPONIN I
TROPONIN I: 0.39 ng/mL — AB (ref <0.031)
Troponin I: 0.44 ng/mL — ABNORMAL HIGH (ref <0.031)

## 2016-05-18 LAB — GLUCOSE, CAPILLARY: GLUCOSE-CAPILLARY: 75 mg/dL (ref 65–99)

## 2016-05-18 MED ORDER — POTASSIUM CHLORIDE CRYS ER 20 MEQ PO TBCR
40.0000 meq | EXTENDED_RELEASE_TABLET | ORAL | Status: DC
  Administered 2016-05-18: 40 meq via ORAL
  Filled 2016-05-18: qty 2

## 2016-05-18 NOTE — Progress Notes (Signed)
Progress Note    Kirk Perkins  [id number] DOB: 11-Sep-1946  DOA: 05/17/2016 PCP: Cathie Olden, MD    Brief Narrative:   Kirk Perkins is an 70 y.o. male with a PMH of HOCM, syncope, SSS status post pacemaker/ICD, atrial fibrillation status post cardioversion with post procedure complicated by hematoma requiring holding of his usual blood thinners who was admitted 05/17/16 with a syncopal event. Orthostatics negative in ED, given 1L IVF. Trop 0.42, EKG showed paced rhythm, denied chest pain or other symptoms at the time of admission. K 2.8, given 71meq IV and 12meq PO potassium. HGB 13.3.  Assessment/Plan:   Principal Problem:   Syncope in the setting of recent pacemaker/ICD placement/first-degree AV block/orthostatic hypotension Possibly from orthostatic hypotension. Cardiology consulted to have ICD interrogated. Systolic pressure noted to be low with systolic pressure of 78 today, 87 on repeat. Blood pressure dropped with orthostatic vital signs this morning. Hold Bumex and Zaroxolyn for now.  Active Problems:   Hypokalemia Repleting.    Hypertrophic cardiomyopathy (HCC)/Automatic implantable cardioverter-defibrillator in situ As above.    Elevated troponin Trend is flat and the patient denies any chest pains.    Hematoma Large hematoma around pacemaker insertion site. Blood thinners on hold.    History of pulmonary embolism Resume blood thinners when able.   Family Communication/Anticipated D/C date and plan/Code Status   DVT prophylaxis: SCDs ordered. Code Status: Full Code.  Family Communication: No family currently at the bedside. Disposition Plan: Home when cleared by cardiology but likely within the next 24 hours.   Medical Consultants:    Cardiology   Procedures:    Anti-Infectives:   Anti-infectives    None      Subjective:    Kirk Perkins denies any further presyncopal episodes, dizziness, or chest pain.  Objective:     Filed Vitals:   05/17/16 0640 05/17/16 1930 05/18/16 0708  BP: 110/84 101/66 78/51  Pulse: 72 72 70  Temp:  97.5 F (36.4 C) 97.6 F (36.4 C)  TempSrc:  Oral Oral  Resp: 17 18 18   SpO2:  100% 100%    Intake/Output Summary (Last 24 hours) at 05/18/16 0806 Last data filed at 05/18/16 0600  Gross per 24 hour  Intake      0 ml  Output    600 ml  Net   -600 ml   There were no vitals filed for this visit.  Exam: General exam: Appears calm and comfortable.  Respiratory system: Clear to auscultation. Respiratory effort normal. Cardiovascular system: S1 & S2 heard, RRR. No JVD,  rubs, gallops or clicks. No murmurs. Gastrointestinal system: Abdomen is nondistended, soft and nontender. No organomegaly or masses felt. Normal bowel sounds heard. Central nervous system: Alert and oriented. No focal neurological deficits. Extremities: No clubbing, edema, or cyanosis. Skin: No rashes, lesions or ulcers. Large hematoma around pacemaker site. Psychiatry: Judgement and insight appear normal. Mood & affect appropriate.   Data Reviewed:   I have personally reviewed following labs and imaging studies:  Labs: Basic Metabolic Panel:  Recent Labs Lab 05/17/16 2034 05/18/16 0215  NA  --  133*  K 3.2* 3.3*  CL  --  95*  CO2  --  28  GLUCOSE  --  73  BUN  --  50*  CREATININE  --  1.19  CALCIUM  --  9.3   GFR Estimated Creatinine Clearance: 46.1 mL/min (by C-G formula based on Cr of 1.19).  Cardiac Enzymes:  Recent Labs Lab 05/17/16 2034  05/18/16 0215  TROPONINI 0.43* 0.44*    Radiology: No results found.  Medications:   . allopurinol  100 mg Oral Daily  . bumetanide  1.5 mg Oral BID  . calcitRIOL  0.25 mcg Oral Once per day on Mon Tue Wed Thu Fri  . ferrous sulfate  325 mg Oral Q breakfast  . levothyroxine  88 mcg Oral QAC breakfast  . metolazone  2.5 mg Oral Once per day on Mon Thu  . potassium chloride SA  40 mEq Oral Once per day on Mon Fri  . pravastatin  20  mg Oral q1800  . sodium chloride flush  3 mL Intravenous Q12H  . tamsulosin  0.4 mg Oral QPC supper   Continuous Infusions:   Time spent: 25 minutes.     [place] [facility] Pager [phone]. If unable to reach me by pager, please call my cell phone at [phone].  *Please refer to [email or web address], password [email or web address] to get updated schedule on who will round on this patient, as hospitalists switch teams weekly. If 7PM-7AM, please contact night-coverage at [email or web address], password [email or web address] for any overnight needs.  05/18/2016, 8:06 AM

## 2016-05-18 NOTE — Care Management Obs Status (Signed)
[place] NOTIFICATION   Patient Details  Name: Kirk Perkins MRN: [id number] Date of Birth: 1946-05-30   Medicare Observation Status Notification Given:  Yes    Dawayne Patricia, RN 05/18/2016, 4:17 PM

## 2016-05-18 NOTE — Consult Note (Addendum)
ELECTROPHYSIOLOGY CONSULT NOTE    Patient ID: Kirk Perkins MRN: [id number], DOB/AGE: 04/30/1946 70 y.o.  Admit date: 05/17/2016 Date of Consult: 05/18/2016  Primary Physician: Cathie Olden, MD Primary Cardiologist: Bensimhon Electrophysiologist: Caryl Comes Referring MD: Rama  Reason for Consultation: syncope  HPI:  Kirk Perkins is a 70 y.o. male with a past medical history significant for hypertension, hyperlipidemia, hypertrophic/restrictive cardiomyopathy, atrial tachycardia and prior PE.  He underwent ICD gen change and DCCV 04/13/16.  Post op, he has had a hematoma which is very slowly resolving and his anticoagulation has been held.  Yesterday, he was sitting outside while waiting on time to go to church. He was feeling dizzy and stood up to go inside when he lost consciousness. He awoke on his own and his daughter called 911.  He was brought to [facility] for further evaluation.  In the ER, orthostatics were negative and he was given 1L of fluids.  Troponin has been elevated but trend flat. K was 2.8.  No arrhythmias on monitor.  EP has been asked to evaluate for treatment options.   He currently denies chest pain, shortness of breath, LE edema, recent fevers, chills, nausea or vomiting.  Past Medical History  Diagnosis Date  . Syncope   . Obstructive cardiomyopathy ([place])   . Sick sinus syndrome ([place])   . HTN (hypertension)   . Hypothyroidism   . Benign prostatic hypertrophy   . Hyperlipidemia   . Atrial tachycardia ([place])   . Pulmonary emboli ([place])   . Mitral valve insufficiency      Surgical History:  Past Surgical History  Procedure Laterality Date  . Insertion of icd  11/14/2009  . Bladder surgery    . Hernia repair  June 2016  . Ep implantable device N/A 04/13/2016    Procedure: ICD Generator Changeout;  Surgeon: Deboraha Sprang, MD;  Location: [place] [facility];  Service: Cardiovascular;  Laterality: N/A;  . Electrophysiologic study N/A 04/13/2016    Procedure:  Cardioversion;  Surgeon: Deboraha Sprang, MD;  Location: [place] [facility];  Service: Cardiovascular;  Laterality: N/A;     Prescriptions prior to admission  Medication Sig Dispense Refill Last Dose  . allopurinol (ZYLOPRIM) 100 MG tablet Take 100 mg by mouth daily.   05/17/2016 at Unknown time  . bumetanide (BUMEX) 1 MG tablet Take 1.5 mg by mouth 2 (two) times daily.   05/17/2016 at am  . calcitRIOL (ROCALTROL) 0.25 MCG capsule Take 0.25 mcg by mouth See admin instructions. Takes 1 cap mon-fri  Skips dose on sat and sun   Past Week at Unknown time  . docusate sodium (COLACE) 100 MG capsule Take 100 mg by mouth 2 (two) times daily.   05/17/2016 at am  . ferrous sulfate 325 (65 FE) MG tablet Take 325 mg by mouth daily with breakfast.   05/17/2016 at Unknown time  . levothyroxine (SYNTHROID, LEVOTHROID) 88 MCG tablet Take 88 mcg by mouth daily before breakfast.   05/17/2016 at Unknown time  . lovastatin (MEVACOR) 20 MG tablet Take 20 mg by mouth at bedtime.   05/16/2016 at Unknown time  . metolazone (ZAROXOLYN) 2.5 MG tablet Take 1 tablet (2.5 mg total) by mouth 2 (two) times a week. Take every Monday and Friday 10 tablet 6 Past Week at Unknown time  . potassium chloride SA (K-DUR,KLOR-CON) 20 MEQ tablet Take 20 mEq by mouth once a week. Take every Monday and Friday with metolazone   Past Week at Unknown time  .  tamsulosin (FLOMAX) 0.4 MG CAPS capsule Take 0.4 mg by mouth daily after supper.   05/16/2016 at Unknown time  . tizanidine (ZANAFLEX) 2 MG capsule Take 1 capsule (2 mg total) by mouth at bedtime as needed for muscle spasms. 30 capsule 5 Past Month at Unknown time    Inpatient Medications:  . allopurinol  100 mg Oral Daily  . bumetanide  1.5 mg Oral BID  . calcitRIOL  0.25 mcg Oral Once per day on Mon Tue Wed Thu Fri  . ferrous sulfate  325 mg Oral Q breakfast  . levothyroxine  88 mcg Oral QAC breakfast  . metolazone  2.5 mg Oral Once per day on Mon Thu  . potassium chloride SA  40 mEq Oral  Once per day on Mon Fri  . pravastatin  20 mg Oral q1800  . sodium chloride flush  3 mL Intravenous Q12H  . tamsulosin  0.4 mg Oral QPC supper    Allergies:  Allergies  Allergen Reactions  . Ace Inhibitors Swelling    Social History   Social History  . Marital Status: Married    Spouse Name: N/A  . Number of Children: 3  . Years of Education: N/A   Occupational History  . retired    Social History Main Topics  . Smoking status: Never Smoker   . Smokeless tobacco: Never Used  . Alcohol Use: No  . Drug Use: No  . Sexual Activity: Not on file   Other Topics Concern  . Not on file   Social History Narrative   Lives with wife, daughter and 2 grandkids in a 2 story home.     Retired from [employer].     Education: high school.     Family History  Problem Relation Age of Onset  . Stroke Father   . Heart disease Father   . Cancer Sister   . Lupus Daughter   . Hypertension Son   . Hypertension Daughter      Review of Systems: All other systems reviewed and are otherwise negative except as noted above.  Physical Exam: Filed Vitals:   05/17/16 0640 05/17/16 1930 05/18/16 0708  BP: 110/84 101/66 78/51  Pulse: 72 72 70  Temp:  97.5 F (36.4 C) 97.6 F (36.4 C)  TempSrc:  Oral Oral  Resp: 17 18 18   SpO2:  100% 100%    GEN- The patient is elderly and chronically ill appearing, alert and oriented x 3 today.   HEENT: normocephalic, atraumatic; sclera clear, conjunctiva pink; hearing intact; oropharynx clear; neck supple  Lungs- Clear to ausculation bilaterally, normal work of breathing  Heart- Regular rate and rhythm  Device pocket well healed; with  hematoma or erythema.  There is no tethering  The hematoma is increasingly sofft and there is some discoloration  GI- soft, non-tender, non-distended, bowel sounds present  Extremities- no clubbing, cyanosis, or edema; DP/PT/radial pulses 2+ bilaterally MS- no significant deformity or atrophy Skin- warm and dry, no  rash or lesion, + soft hematoma over ICD site  Psych- euthymic mood, full affect Neuro- strength and sensation are intact  Labs:   Lab Results  Component Value Date   WBC 4.9 04/13/2016   HGB 16.0 04/13/2016   HCT 47.0 04/13/2016   MCV 99.5 04/13/2016   PLT 122* 04/13/2016    Recent Labs Lab 05/18/16 0215  NA 133*  K 3.3*  CL 95*  CO2 28  BUN 50*  CREATININE 1.19  CALCIUM 9.3  GLUCOSE 73  Radiology/Studies: No results found.  EKG: ordered, not yet done  TELEMETRY: sinus rhythm with ventricular pacing and prolonged PR interval  Assessment/Plan: 1.  Syncope The patient presents with a syncopal episode at home.  Orthostatics negative but story suggestive of vasovagal syncope.  Will also have ICD interrogated to look for ventricular arrhythmias.   2.  HCM Normal device function at last device check Will interrogate ICD today as above  3.  1st degree AV block At last device interrogation, pt V pacing 94% of the time in SR with SAV of 312msec. Will shorten AV delays today to allow for more physiologic AV pacing  4.  ICD hematoma Resolving  5.  Persistent atrial fibrillation Maintaining SR post DCCV Resume [place] for CHADS2VASC of 2 when able  6.  Prior PE Will defer to MD need to re-evaluate with syncope and being off [place] for several weeks  7. Elevated troponin Trend flat No ischemic symptoms EKG ordered  Dr Caryl Comes to see later today    Signed, Chanetta Marshall, NP 05/18/2016 12:35 PM   Syncope most consistent with vasomotor BP too low; will try an abd binder to mitigate as would like to avoid vasoactive drugs with  Have reviewed physiology and the need to be attentive to triggers Will apply bandage to wound and resume anticoagulation Ok to discharge

## 2016-05-19 ENCOUNTER — Encounter (HOSPITAL_COMMUNITY): Payer: Self-pay | Admitting: General Practice

## 2016-05-19 DIAGNOSIS — R7989 Other specified abnormal findings of blood chemistry: Secondary | ICD-10-CM | POA: Diagnosis not present

## 2016-05-19 DIAGNOSIS — I422 Other hypertrophic cardiomyopathy: Secondary | ICD-10-CM | POA: Diagnosis not present

## 2016-05-19 DIAGNOSIS — I959 Hypotension, unspecified: Secondary | ICD-10-CM

## 2016-05-19 DIAGNOSIS — I951 Orthostatic hypotension: Secondary | ICD-10-CM | POA: Diagnosis not present

## 2016-05-19 DIAGNOSIS — Z95810 Presence of automatic (implantable) cardiac defibrillator: Secondary | ICD-10-CM | POA: Diagnosis not present

## 2016-05-19 DIAGNOSIS — R55 Syncope and collapse: Secondary | ICD-10-CM | POA: Diagnosis not present

## 2016-05-19 LAB — CBC
HCT: 36.6 % — ABNORMAL LOW (ref 39.0–52.0)
Hemoglobin: 11.9 g/dL — ABNORMAL LOW (ref 13.0–17.0)
MCH: 33.1 pg (ref 26.0–34.0)
MCHC: 32.5 g/dL (ref 30.0–36.0)
MCV: 101.9 fL — ABNORMAL HIGH (ref 78.0–100.0)
Platelets: 109 10*3/uL — ABNORMAL LOW (ref 150–400)
RBC: 3.59 MIL/uL — ABNORMAL LOW (ref 4.22–5.81)
RDW: 14.5 % (ref 11.5–15.5)
WBC: 5.3 10*3/uL (ref 4.0–10.5)

## 2016-05-19 LAB — GLUCOSE, CAPILLARY: GLUCOSE-CAPILLARY: 90 mg/dL (ref 65–99)

## 2016-05-19 MED ORDER — APIXABAN 5 MG PO TABS
5.0000 mg | ORAL_TABLET | Freq: Two times a day (BID) | ORAL | Status: DC
  Administered 2016-05-19: 5 mg via ORAL
  Filled 2016-05-19: qty 1

## 2016-05-19 MED ORDER — HYDROCODONE-ACETAMINOPHEN 5-325 MG PO TABS
1.0000 | ORAL_TABLET | ORAL | Status: DC | PRN
  Administered 2016-05-19: 2 via ORAL
  Filled 2016-05-19: qty 2

## 2016-05-19 MED ORDER — APIXABAN 5 MG PO TABS: 5.0000 mg | ORAL_TABLET | Freq: Two times a day (BID) | ORAL | Status: AC

## 2016-05-19 NOTE — Discharge Instructions (Signed)
Information on my medicine - ELIQUIS (apixaban)  This medication education was reviewed with me or my healthcare representative as part of my discharge preparation.  The pharmacist that spoke with me during my hospital stay was:  Georgina Peer, [facility]  Why was Eliquis prescribed for you? Eliquis was prescribed for you to reduce the risk of forming blood clots that can cause a stroke if you have a medical condition called atrial fibrillation (a type of irregular heartbeat) and history of pulmonary embolus.  What do You need to know about Eliquis ? Take your Eliquis TWICE DAILY - one tablet in the morning and one tablet in the evening with or without food.  It would be best to take the doses about the same time each day.  If you have difficulty swallowing the tablet whole please discuss with your pharmacist how to take the medication safely.  Take Eliquis exactly as prescribed by your doctor and DO NOT stop taking Eliquis without talking to the doctor who prescribed the medication.  Stopping may increase your risk of developing a new clot or stroke.  Refill your prescription before you run out.  After discharge, you should have regular check-up appointments with your healthcare provider that is prescribing your Eliquis.  In the future your dose may need to be changed if your kidney function or weight changes by a significant amount or as you get older.  What do you do if you miss a dose? If you miss a dose, take it as soon as you remember on the same day and resume taking twice daily.  Do not take more than one dose of ELIQUIS at the same time.  Important Safety Information A possible side effect of Eliquis is bleeding. You should call your healthcare provider right away if you experience any of the following: ? Bleeding from an injury or your nose that does not stop. ? Unusual colored urine (red or dark brown) or unusual colored stools (red or black). ? Unusual bruising for unknown  reasons. ? A serious fall or if you hit your head (even if there is no bleeding).  Some medicines may interact with Eliquis and might increase your risk of bleeding or clotting while on Eliquis. To help avoid this, consult your healthcare provider or pharmacist prior to using any new prescription or non-prescription medications, including herbals, vitamins, non-steroidal anti-inflammatory drugs (NSAIDs) and supplements.  This website has more information on Eliquis (apixaban): [email or web address].

## 2016-05-19 NOTE — Progress Notes (Signed)
ANTICOAGULATION CONSULT NOTE - Initial Consult  Pharmacy Consult for apixaban Indication: history of afib, history of PE  Allergies  Allergen Reactions  . Ace Inhibitors Swelling    Patient Measurements: Weight: 136 lb 1.6 oz (61.735 kg)   Vital Signs: Temp: 97.7 F (36.5 C) (06/20 0308) Temp Source: Oral (06/20 0308) BP: 89/59 mmHg (06/20 0308) Pulse Rate: 75 (06/20 0308)  Labs:  Recent Labs  05/17/16 2034 05/18/16 0215 05/18/16 0823  CREATININE  --  1.19  --   TROPONINI 0.43* 0.44* 0.39*    Estimated Creatinine Clearance: 50.4 mL/min (by C-G formula based on Cr of 1.19).   Medical History: Past Medical History  Diagnosis Date  . Syncope   . Obstructive cardiomyopathy ([place])   . Sick sinus syndrome ([place])   . HTN (hypertension)   . Hypothyroidism   . Benign prostatic hypertrophy   . Hyperlipidemia   . Atrial tachycardia ([place])   . Pulmonary emboli ([place])   . Mitral valve insufficiency    Assessment: 70 year old male admitted after syncopal episode. Patient with history of afib and PE on apixaban previously but was held d/t ICD hematoma. Hematoma is resolving and EP deemed patient ok to resume anticoagulation.   No dose adjustments with history of PE.  Goal of Therapy:  Monitor platelets by anticoagulation protocol: Yes   Plan:  Resume apixaban 5mg  bid  Erin Hearing PharmD., BCPS Clinical Pharmacist Pager [phone] 05/19/2016 9:46 AM

## 2016-05-19 NOTE — Evaluation (Signed)
Physical Therapy Evaluation Patient Details Name: Kirk Perkins MRN: [id number] DOB: 10/31/1946 Today's Date: 05/19/2016   History of Present Illness  70 yo male with onset of syncope, recent pacer/ICD replacement and cardioversion for a-fib recently was referred to PT for discharge instructions.  PMHx:  SSS, a-fib, HOCM, cardiomyopathy, HLD, HTN, a-tachycardia, PE  Clinical Impression  Pt is getting up to walk with assist of PT and managing walker with min guard but is still a risk of fall.  Will have HHPT continue after discharge today and will anticipate he needs to work on endurance and mitigating fall risk.    Follow Up Recommendations Home health PT;Supervision for mobility/OOB    Equipment Recommendations  None recommended by PT    Recommendations for Other Services Rehab consult     Precautions / Restrictions Precautions Precautions: Fall;ICD/Pacemaker Precaution Comments: recently replaced device Required Braces or Orthoses:  (no longer in sling) Restrictions Weight Bearing Restrictions: No      Mobility  Bed Mobility Overal bed mobility: Modified Independent             General bed mobility comments: HOB minimally elevated  Transfers Overall transfer level: Modified independent Equipment used: Rolling walker (2 wheeled)             General transfer comment: cued hand placement   Ambulation/Gait Ambulation/Gait assistance: Supervision;Min guard Ambulation Distance (Feet): 120 Feet Assistive device: Rolling walker (2 wheeled);1 person hand held assist Gait Pattern/deviations: Step-through pattern;Wide base of support;Trunk flexed;Decreased stride length Gait velocity: reduced Gait velocity interpretation: Below normal speed for age/gender General Gait Details: pt has hip flexion contractures  Stairs            Wheelchair Mobility    Modified Rankin (Stroke Patients Only)       Balance Overall balance assessment: History of Falls                                           Pertinent Vitals/Pain Pain Assessment: No/denies pain    Home Living Family/patient expects to be discharged to:: Private residence Living Arrangements: Spouse/significant other;Children Available Help at Discharge: Family;Available 24 hours/day Type of Home: House Home Access: Level entry     Home Layout: Two level;Able to live on main level with bedroom/bathroom Home Equipment: Gilford Rile - 2 wheels;Cane - single point;Bedside commode Additional Comments: Pt is able to answer questions well, aware of his need for help    Prior Function Level of Independence: Independent with assistive device(s) (SPC)               Hand Dominance        Extremity/Trunk Assessment   Upper Extremity Assessment: LUE deficits/detail (guards due to previous ICD replacement)           Lower Extremity Assessment: Overall WFL for tasks assessed      Cervical / Trunk Assessment: Kyphotic  Communication   Communication: No difficulties  Cognition Arousal/Alertness: Awake/alert Behavior During Therapy: WFL for tasks assessed/performed Overall Cognitive Status: Within Functional Limits for tasks assessed                      General Comments General comments (skin integrity, edema, etc.): Pt is up to steady on walker and fairly comfortable, low endurance.  Needs supervised walking and will have family in to help him at home    Exercises  Assessment/Plan    PT Assessment All further PT needs can be met in the next venue of care;Patient needs continued PT services  PT Diagnosis Difficulty walking   PT Problem List Decreased range of motion;Decreased activity tolerance;Decreased balance;Decreased mobility;Decreased coordination;Decreased knowledge of use of DME;Decreased safety awareness;Cardiopulmonary status limiting activity;Decreased skin integrity  PT Treatment Interventions     PT Goals (Current goals can be  found in the Care Plan section) Acute Rehab PT Goals Patient Stated Goal: to get home PT Goal Formulation: With patient Time For Goal Achievement: 06/02/16 Potential to Achieve Goals: Good    Frequency     Barriers to discharge Other (comment) (needs helpl from family who are with him at home)      Co-evaluation               End of Session Equipment Utilized During Treatment: Gait belt Activity Tolerance: Patient tolerated treatment well;Patient limited by fatigue Patient left: in bed;with call bell/phone within reach Nurse Communication: Mobility status    Functional Assessment Tool Used: clinical judgment Functional Limitation: Mobility: Walking and moving around Mobility: Walking and Moving Around Current Status (E7076): At least 20 percent but less than 40 percent impaired, limited or restricted Mobility: Walking and Moving Around Goal Status [phone]): At least 1 percent but less than 20 percent impaired, limited or restricted    Time: 0955-1018 PT Time Calculation (min) (ACUTE ONLY): 23 min   Charges:   PT Evaluation $PT Eval Low Complexity: 1 Procedure PT Treatments $Gait Training: 8-22 mins   PT G Codes:   PT G-Codes **NOT FOR INPATIENT CLASS** Functional Assessment Tool Used: clinical judgment Functional Limitation: Mobility: Walking and moving around Mobility: Walking and Moving Around Current Status (U3735): At least 20 percent but less than 40 percent impaired, limited or restricted Mobility: Walking and Moving Around Goal Status [phone]): At least 1 percent but less than 20 percent impaired, limited or restricted    Ramond Dial 05/19/2016, 10:38 AM    Mee Hives, PT MS Acute Rehab Dept. Number: [place] [facility] [place]

## 2016-05-19 NOTE — Discharge Summary (Signed)
Physician Discharge Summary  Kirk Perkins [id number] DOB: 1946-09-28 DOA: 05/17/2016  PCP: Cathie Olden, MD  Admit date: 05/17/2016 Discharge date: 05/19/2016   Recommendations for Outpatient Follow-Up:   1. Home health PT set up. 2. Cardiology follow-up scheduled on 05/27/16. 3. Consider discontinuing Flomax if blood pressure remains low.   Discharge Diagnosis:   Principal Problem:    Syncope Active Problems:    Hypertrophic cardiomyopathy (HCC)    Automatic implantable cardioverter-defibrillator in situ    Elevated troponin    Orthostatic hypotension    Hypokalemia    Hypotension    Hematoma at pacemaker insertion site   Discharge disposition:  Home.    Discharge Condition: Improved.  Diet recommendation: Low sodium, heart healthy.    History of Present Illness:   Kirk [facility] is an 70 y.o. male with a PMH of HOCM, syncope, SSS status post pacemaker/ICD, atrial fibrillation status post cardioversion with post procedure complicated by hematoma requiring holding of his usual blood thinners who was admitted 05/17/16 with a syncopal event. Orthostatics negative in ED, given 1L IVF. Trop 0.42, EKG showed paced rhythm, denied chest pain or other symptoms at the time of admission. K 2.8, given 12meq IV and 56meq PO potassium. HGB 13.3.   Hospital Course by Problem:   Principal Problem:  Syncope in the setting of recent pacemaker/ICD placement/first-degree AV block/orthostatic hypotension Possibly from orthostatic hypotension. Cardiology consulted to have ICD interrogated. No discharges. Systolic pressures low. Blood pressure dropped with orthostatic vital signs. Bumex and Zaroxolyn held 05/19/16. Patient also on Flomax which he says he continues to need. Counseled on how to minimize the risk of future syncopal episodes by slowly changing position. Home physical therapy set up.  Active Problems:  Hypokalemia Repleted.   Hypertrophic  cardiomyopathy (HCC)/Automatic implantable cardioverter-defibrillator in situ As above.   Elevated troponin Trend is flat and the patient denies any chest pains.   Hematoma Large hematoma around pacemaker insertion site. Blood thinners on hold. Resume per cardiology recommendations.   History of pulmonary embolism Resume Eliquis.    Medical Consultants:    Cardiology   Discharge Exam:   Filed Vitals:   05/19/16 0941 05/19/16 0942  BP: 80/58 81/56  Pulse:    Temp:    Resp:     Filed Vitals:   05/18/16 2129 05/19/16 0308 05/19/16 0941 05/19/16 0942  BP: 94/64 89/59 80/58  81/56  Pulse: 79 75    Temp: 98.2 F (36.8 C) 97.7 F (36.5 C)    TempSrc: Oral Oral    Resp: 18 18    Height:  5\' 10"  (1.778 m)    Weight:  61.735 kg (136 lb 1.6 oz)    SpO2: 100% 98%      Gen:  NAD Cardiovascular:  RRR, No M/R/G Respiratory: Lungs CTAB Gastrointestinal: Abdomen soft, NT/ND with normal active bowel sounds. Extremities: No C/E/C   The results of significant diagnostics from this hospitalization (including imaging, microbiology, ancillary and laboratory) are listed below for reference.     Procedures and Diagnostic Studies:   No results found.   Labs:   Basic Metabolic Panel:  Recent Labs Lab 05/17/16 2034 05/18/16 0215  NA  --  133*  K 3.2* 3.3*  CL  --  95*  CO2  --  28  GLUCOSE  --  73  BUN  --  50*  CREATININE  --  1.19  CALCIUM  --  9.3   GFR Estimated Creatinine Clearance: 50.4 mL/min (by C-G formula based on Cr  of 1.19). Liver Function Tests: No results for input(s): AST, ALT, ALKPHOS, BILITOT, PROT, ALBUMIN in the last 168 hours. No results for input(s): LIPASE, AMYLASE in the last 168 hours. No results for input(s): AMMONIA in the last 168 hours. Coagulation profile No results for input(s): INR, PROTIME in the last 168 hours.  CBC:  Recent Labs Lab 05/19/16 0928  WBC 5.3  HGB 11.9*  HCT 36.6*  MCV 101.9*  PLT 109*   Cardiac  Enzymes:  Recent Labs Lab 05/17/16 2034 05/18/16 0215 05/18/16 0823  TROPONINI 0.43* 0.44* 0.39*   BNP: Invalid input(s): POCBNP CBG:  Recent Labs Lab 05/18/16 0656 05/19/16 0614  GLUCAP 75 90     Discharge Instructions:   Discharge Instructions    Call MD for:  extreme fatigue    Complete by:  As directed      Call MD for:  persistant dizziness or light-headedness    Complete by:  As directed      Diet - low sodium heart healthy    Complete by:  As directed      Face-to-face encounter (required for Medicare/Medicaid patients)    Complete by:  As directed   I Domingue Coltrain certify that this patient is under my care and that I, or a nurse practitioner or physician's assistant working with me, had a face-to-face encounter that meets the physician face-to-face encounter requirements with this patient on 05/19/2016. The encounter with the patient was in whole, or in part for the following medical condition(s) which is the primary reason for home health care (List medical condition): Syncope, fall, needs PT  The encounter with the patient was in whole, or in part, for the following medical condition, which is the primary reason for home health care:  Syncope  I certify that, based on my findings, the following services are medically necessary home health services:  Physical therapy  Reason for Medically Necessary Home Health Services:   Therapy- Therapeutic Exercises to Increase Strength and Endurance Therapy- Home Adaptation to Facilitate Safety    My clinical findings support the need for the above services:  Unable to leave home safely without assistance and/or assistive device  Further, I certify that my clinical findings support that this patient is homebound due to:  Unable to leave home safely without assistance     Home Health    Complete by:  As directed   To provide the following care/treatments:  PT     Increase activity slowly    Complete by:  As directed               Medication List    TAKE these medications        allopurinol 100 MG tablet  Commonly known as:  ZYLOPRIM  Take 100 mg by mouth daily.     apixaban 5 MG Tabs tablet  Commonly known as:  ELIQUIS  Take 1 tablet (5 mg total) by mouth 2 (two) times daily.     bumetanide 1 MG tablet  Commonly known as:  BUMEX  Take 1.5 mg by mouth 2 (two) times daily.     calcitRIOL 0.25 MCG capsule  Commonly known as:  ROCALTROL  Take 0.25 mcg by mouth See admin instructions. Takes 1 cap mon-fri  Skips dose on sat and sun     docusate sodium 100 MG capsule  Commonly known as:  COLACE  Take 100 mg by mouth 2 (two) times daily.     ferrous sulfate 325 (65 FE) MG  tablet  Take 325 mg by mouth daily with breakfast.     levothyroxine 88 MCG tablet  Commonly known as:  SYNTHROID, LEVOTHROID  Take 88 mcg by mouth daily before breakfast.     lovastatin 20 MG tablet  Commonly known as:  MEVACOR  Take 20 mg by mouth at bedtime.     metolazone 2.5 MG tablet  Commonly known as:  ZAROXOLYN  Take 1 tablet (2.5 mg total) by mouth 2 (two) times a week. Take every Monday and Friday     potassium chloride SA 20 MEQ tablet  Commonly known as:  K-DUR,KLOR-CON  Take 20 mEq by mouth once a week. Take every Monday and Friday with metolazone     tamsulosin 0.4 MG Caps capsule  Commonly known as:  FLOMAX  Take 0.4 mg by mouth daily after supper.     tizanidine 2 MG capsule  Commonly known as:  ZANAFLEX  Take 1 capsule (2 mg total) by mouth at bedtime as needed for muscle spasms.           Follow-up Information    Follow up with [facility] On 05/27/2016.   Specialty:  Cardiology   Why:  2:30PM, wound check   Contact information:   [address], [place]      Follow up with [facility].   Why:  HHPT arranged- they will call you to set up home visits- (contact # [phone])    Contact information:    [place] [address] [phone]        Time coordinating discharge: 30 minutes.  Signed:  Tarique Loveall  Pager [phone] [facility] 05/19/2016, 2:33 PM

## 2016-05-19 NOTE — Care Management Note (Signed)
Case Management Note Marvetta Gibbons RN, BSN Unit 2W-Case Manager [phone]  Patient Details  Name: Kirk Perkins MRN: [id number] Date of Birth: [birth year]  Subjective/Objective:    Pt admitted with syncope- recent PPM placement                Action/Plan: PTA Pt lived at home- PT recommendation for HH-PT- order has been placed- spoke with pt at bedside- choice offered for [facility] agencies in [place]- per pt he has used [facility] services with [facility] in [place]- would like to use them again- call made to [facility] in [place]- spoke with Madaline Savage- who confirmed that they have seen pt in the past- referral accepted with start of care for [facility]. June 22- pts' info and orders faxed to [facility] to Fax# [phone]- per epic  Expected Discharge Date:  05/19/16              Expected Discharge Plan:  [place]  In-House Referral:     Discharge planning Services  CM Consult  Post Acute Care Choice:  Home Health Choice offered to:  Patient  DME Arranged:  N/A DME Agency:  NA  HH Arranged:  PT [place] Agency:  [facility]  Status of Service:  Completed, signed off  If discussed at [place] of Stay Meetings, dates discussed:    Discharge Disposition: Home/Home health   Additional Comments:  Dawayne Patricia, RN 05/19/2016, 12:35 PM

## 2016-05-19 NOTE — Progress Notes (Signed)
Pt awaiting ride for discharge. Discharge education complete - educated patient in discharge instructions, medications, f/u appointments, and when to call MD. IV & telemetry removed, CCMD notified.   Fritz Pickerel, RN

## 2016-05-19 NOTE — Progress Notes (Signed)
       Patient Name: Kirk Perkins      SUBJECTIVE without complaint  Past Medical History  Diagnosis Date  . Syncope   . Obstructive cardiomyopathy ([place])   . Sick sinus syndrome ([place])   . HTN (hypertension)   . Hypothyroidism   . Benign prostatic hypertrophy   . Hyperlipidemia   . Atrial tachycardia ([place])   . Pulmonary emboli ([place])   . Mitral valve insufficiency     Scheduled Meds:  Scheduled Meds: . allopurinol  100 mg Oral Daily  . calcitRIOL  0.25 mcg Oral Once per day on Mon Tue Wed Thu Fri  . ferrous sulfate  325 mg Oral Q breakfast  . levothyroxine  88 mcg Oral QAC breakfast  . potassium chloride SA  40 mEq Oral Once per day on Mon Fri  . pravastatin  20 mg Oral q1800  . sodium chloride flush  3 mL Intravenous Q12H  . tamsulosin  0.4 mg Oral QPC supper   Continuous Infusions:  HYDROcodone-acetaminophen, tiZANidine    PHYSICAL EXAM Filed Vitals:   05/18/16 1442 05/18/16 1444 05/18/16 2129 05/19/16 0308  BP: 87/73 95/65 94/64  89/59  Pulse: 77  79 75  Temp:  98.5 F (36.9 C) 98.2 F (36.8 C) 97.7 F (36.5 C)  TempSrc:  Oral Oral Oral  Resp: 18  18 18   Weight:    136 lb 1.6 oz (61.735 kg)  SpO2: 100%  100% 98%    Well developed and nourished in no acute distress HENT normal Neck supple with JVP-flat Clear Hematoma present Regular rate and rhythm, no murmurs or gallops Abd-soft with active BS No Clubbing cyanosis edema Skin-warm and dry A & Oriented  Grossly normal sensory and motor function  TELEMETRY: Reviewed telemetry pt in *P-synchronous/ AV  pacing     Intake/Output Summary (Last 24 hours) at 05/19/16 0820 Last data filed at 05/19/16 [id number]  Gross per 24 hour  Intake    490 ml  Output    650 ml  Net   -160 ml    LABS: Basic Metabolic Panel:  Recent Labs Lab 05/17/16 2034 05/18/16 0215  NA  --  133*  K 3.2* 3.3*  CL  --  95*  CO2  --  28  GLUCOSE  --  73  BUN  --  50*  CREATININE  --  1.19  CALCIUM  --  9.3   Cardiac  Enzymes:  Recent Labs  05/17/16 2034 05/18/16 0215 05/18/16 0823  TROPONINI 0.43* 0.44* 0.39*   CB    ASSESSMENT AND PLAN:  Principal Problem:   Syncope Active Problems:   Hypertrophic cardiomyopathy (HCC)   Automatic implantable cardioverter-defibrillator in situ   Elevated troponin   Orthostatic hypotension   Hypokalemia   Hypotension  With hypotensin can his alpha blocker be stopped  Ok to discharge  Signed, Virl Axe MD  05/19/2016

## 2016-05-19 NOTE — Progress Notes (Signed)
Pressure applied to ICD site 15 minutes, no bleeding, patient tolerated well, new pressure dressing applied.  Wound is well healed.  OK to resume his Berwind (Eliquis 5mg  PO BID), discussed with Dr. Caryl Comes.  Wound check follow up is scheduled.  Tommye Standard, PA-C

## 2016-05-20 ENCOUNTER — Encounter: Payer: Medicare Other | Admitting: Internal Medicine

## 2016-05-25 ENCOUNTER — Telehealth: Payer: Self-pay | Admitting: Internal Medicine

## 2016-05-25 NOTE — Telephone Encounter (Signed)
Spoke with patient's daughter.  She reports that he is still in the ED currently.  He has not been admitted yet.  Requested that patient's daughter have his nurse in the ED call the device clinic to determine if patient's ICD has been interrogated.  Patient's daughter states his device has not been checked.  Gave device clinic direct number and patient's daughter states she will speak with patient's nurse.  She states that if patient is admitted, she will bring his Merlin monitor and cell adapter to the hospital in the event that his device is not checked at the ED.  Patient's daughter is appreciative of assistance and denies additional questions or concerns at this time.

## 2016-05-25 NOTE — Telephone Encounter (Signed)
LMOVM requesting call back from patient's daughter.  Will ask if patient's ICD was interrogated at hospital and if not, if patient's daughter can send a manual transmission from patient's Merlin monitor for review.

## 2016-05-25 NOTE — Telephone Encounter (Signed)
Patient's nurse returned call.  Advised RN that patient has a SJM dual chamber ICD.  She reports that the patient's ICD has not been checked.  Gave the phone number for Merlin/St. Jude tech services to page local rep if if MD chooses to interrogate patient's device (no access to MOD).  Patient's nurse appreciative and denies additional questions or concerns at this time.

## 2016-05-25 NOTE — Telephone Encounter (Signed)
New message   Pt daughter is calling, pt went to his kidney doctor and his BP was low  He became unresponsive, and they have sent him to the hospital where he was admitted [facility]

## 2016-05-25 NOTE — Telephone Encounter (Signed)
Patient's daughter wanted Dr. Caryl Comes to know that patient had another syncopal episode and is currently at [facility], [facility]. Will forward to Dr. Caryl Comes and [place] clinic so they are aware.

## 2016-05-27 ENCOUNTER — Ambulatory Visit: Payer: Medicare Other

## 2016-06-03 ENCOUNTER — Telehealth (HOSPITAL_COMMUNITY): Payer: Self-pay | Admitting: Vascular Surgery

## 2016-06-03 NOTE — Telephone Encounter (Signed)
Spoke w/pt's daughter.  Pt was in [facility] 6/20 for syncope and a few days later he passed out again and was taken to [facility].  She states they decreased his Bumex to 0.5 mg daily at d/c and since then pt has been retaining fluid.  She reports he saw his nephrologist this AM adn he increased Bumex to 1 mg Twice daily and restarted Metolazone twice a week but she would like for him to see Dr Haroldine Laws soon.  Appt sch for tomorrow at 11:40 am

## 2016-06-03 NOTE — Telephone Encounter (Signed)
Please call pt daughter , pt needs to be ASAP  been in the hospital Twice PER DAUGHTER

## 2016-06-04 ENCOUNTER — Ambulatory Visit: Payer: Medicare Other

## 2016-06-04 ENCOUNTER — Ambulatory Visit (HOSPITAL_COMMUNITY)
Admission: RE | Admit: 2016-06-04 | Discharge: 2016-06-04 | Disposition: A | Discharge status: Home / Self Care | Payer: Medicare Other | Source: Ambulatory Visit | Attending: Internal Medicine | Admitting: Internal Medicine

## 2016-06-04 VITALS — BP 98/64 | HR 74 | Wt 159.2 lb

## 2016-06-04 DIAGNOSIS — I480 Paroxysmal atrial fibrillation: Secondary | ICD-10-CM | POA: Diagnosis not present

## 2016-06-04 DIAGNOSIS — R0989 Other specified symptoms and signs involving the circulatory and respiratory systems: Secondary | ICD-10-CM

## 2016-06-04 DIAGNOSIS — I43 Cardiomyopathy in diseases classified elsewhere: Secondary | ICD-10-CM | POA: Diagnosis not present

## 2016-06-04 DIAGNOSIS — N400 Enlarged prostate without lower urinary tract symptoms: Secondary | ICD-10-CM | POA: Diagnosis not present

## 2016-06-04 DIAGNOSIS — I5040 Unspecified combined systolic (congestive) and diastolic (congestive) heart failure: Secondary | ICD-10-CM | POA: Insufficient documentation

## 2016-06-04 DIAGNOSIS — Z95810 Presence of automatic (implantable) cardiac defibrillator: Secondary | ICD-10-CM | POA: Insufficient documentation

## 2016-06-04 DIAGNOSIS — I421 Obstructive hypertrophic cardiomyopathy: Secondary | ICD-10-CM | POA: Diagnosis not present

## 2016-06-04 DIAGNOSIS — R55 Syncope and collapse: Secondary | ICD-10-CM | POA: Diagnosis not present

## 2016-06-04 DIAGNOSIS — E854 Organ-limited amyloidosis: Secondary | ICD-10-CM | POA: Diagnosis not present

## 2016-06-04 DIAGNOSIS — I495 Sick sinus syndrome: Secondary | ICD-10-CM | POA: Insufficient documentation

## 2016-06-04 DIAGNOSIS — I951 Orthostatic hypotension: Secondary | ICD-10-CM

## 2016-06-04 DIAGNOSIS — C679 Malignant neoplasm of bladder, unspecified: Secondary | ICD-10-CM | POA: Diagnosis not present

## 2016-06-04 DIAGNOSIS — E785 Hyperlipidemia, unspecified: Secondary | ICD-10-CM | POA: Insufficient documentation

## 2016-06-04 DIAGNOSIS — E039 Hypothyroidism, unspecified: Secondary | ICD-10-CM | POA: Insufficient documentation

## 2016-06-04 DIAGNOSIS — Z7901 Long term (current) use of anticoagulants: Secondary | ICD-10-CM | POA: Diagnosis not present

## 2016-06-04 DIAGNOSIS — I130 Hypertensive heart and chronic kidney disease with heart failure and stage 1 through stage 4 chronic kidney disease, or unspecified chronic kidney disease: Secondary | ICD-10-CM | POA: Insufficient documentation

## 2016-06-04 DIAGNOSIS — Z86711 Personal history of pulmonary embolism: Secondary | ICD-10-CM | POA: Diagnosis not present

## 2016-06-04 MED ORDER — MIDODRINE HCL 5 MG PO TABS: 5.0000 mg | ORAL_TABLET | Freq: Three times a day (TID) | ORAL | Status: AC

## 2016-06-04 NOTE — Patient Instructions (Signed)
Stop Metolazone if weight is <140 lb  Start Midodrine 5 mg Three times a day   Your physician recommends that you schedule a follow-up appointment in: 4 weeks

## 2016-06-04 NOTE — Progress Notes (Signed)
Patient ID: Kirk Perkins, male   DOB: [birth year], 70 y.o.   MRN: [id number]    ADVANCED HF CLINIC NOTE  Referring Physician: Dr. Patsi Perkins ([place]) Primary Cardiologist: Dr. Chancy Perkins  HPI:  Kirk Perkins is a 70 y/o male with HTN, HL, hypertrophic/restrictive CM with recurrent syncope s/p St. Jude ICD, atrial tachycardia and PE referred by Dr. Chancy Perkins for further HF evaluation.  Kirk Perkins has appeared to carry the diagnosis of HOCM for over 15 years based on echo findings of LVH and mild SAM. Underwent ST Jude ICD placement in 2009 due to recurrent syncope.  We saw him for the first time in consult 02/05/16 as he was struggling more with volume overload and SOB. He has multiple admissions to the [facility] for LE edema requiring IV diuresis. Echo in 10/16 reportedly showed EF 50-55% with moderate- severe (progressive) LVH with increased echogenicity raising suspicion for amyloid. Fat abdominal fat pas biopsy which was normal. At that time we added metolazone 2.5 2x/week with kcl 20 each time. SPEP/UPEP negative.Ferritin ok.  Repeat echo ordered EF 40-45% with very thick speckled myocardium highly suggestive of amyloid. Severe septal HK. RV moderately decreased function. LA 4.7 cm with moderate MR.  Also found to be back in AF since 12/16 on device interrogation.   Since we last saw him underwent technetium pyrophosphate scan at [facility] suggestive of diffuse transthyretin cardiac amyloidosis.  Also underwent DC-CV of AF in 5/17.   Admitted to [facility] in June with syncope due to orthostatic hypotension. No events on ICD. Had recurrent syncope 2 weeks ago and admitted to [facility]. Bumex was cut from 1.5 tablets bid to 0.5 tab bid and metolazone stopped. Saw Dr. Iona Perkins (Nephrology) yesterday and bumex increased to 1 tab bid and metolazone on Monday and Firday.   Here is here for f/u: Weight at home 152 now. At lowest was 136. No longer dizzy. Says previously BP would drop in morning  when getting up. Continues to wear compression stockings. No palpitations. No CP. Sleeps in recliner. + orthopnea.  +DOE.   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 Battery life 4 months left     Past Medical History  Diagnosis Date  . Syncope   . Obstructive cardiomyopathy ([place])   . Sick sinus syndrome ([place])   . HTN (hypertension)   . Hypothyroidism   . Benign prostatic hypertrophy   . Hyperlipidemia   . Atrial tachycardia ([place])   . Pulmonary emboli ([place])   . Mitral valve insufficiency   . AICD (automatic cardioverter/defibrillator) present   . Carpal tunnel syndrome   . Hypotension     Current Outpatient Prescriptions  Medication Sig Dispense Refill  . allopurinol (ZYLOPRIM) 100 MG tablet Take 100 mg by mouth daily.    Marland Kitchen apixaban (ELIQUIS) 5 MG TABS tablet Take 1 tablet (5 mg total) by mouth 2 (two) times daily. 60 tablet   . bumetanide (BUMEX) 1 MG tablet Take 1 mg by mouth 2 (two) times daily.    . calcitRIOL (ROCALTROL) 0.25 MCG capsule Take 0.25 mcg by mouth See admin instructions. Takes 1 cap mon-fri  Skips dose on sat and sun    . docusate sodium (COLACE) 100 MG capsule Take 100 mg by mouth 2 (two) times daily.    Marland Kitchen  ferrous sulfate 325 (65 FE) MG tablet Take 325 mg by mouth daily with breakfast.    . levothyroxine (SYNTHROID, LEVOTHROID) 88 MCG tablet Take 88 mcg by mouth daily before breakfast.    . lovastatin (MEVACOR) 20 MG tablet Take 20 mg by mouth at bedtime.    . metolazone (ZAROXOLYN) 2.5 MG tablet Take 1 tablet (2.5 mg total) by mouth 2 (two) times a week. Take every Monday and Friday 10 tablet 6  . potassium chloride SA (K-DUR,KLOR-CON) 20 MEQ tablet Take 20 mEq by mouth once a week. Take every Monday and Friday with metolazone    . tamsulosin (FLOMAX) 0.4 MG CAPS capsule Take  0.4 mg by mouth daily after supper.     No current facility-administered medications for this encounter.    Allergies  Allergen Reactions  . Ace Inhibitors Swelling      Social History   Social History  . Marital Status: Married    Spouse Name: N/A  . Number of Children: 3  . Years of Education: N/A   Occupational History  . retired    Social History Main Topics  . Smoking status: Never Smoker   . Smokeless tobacco: Never Used  . Alcohol Use: No  . Drug Use: No  . Sexual Activity: Not on file   Other Topics Concern  . Not on file   Social History Narrative   Lives with wife, daughter and 2 grandkids in a 2 story home.     Retired from [employer].     Education: high school.      Family History  Problem Relation Age of Onset  . Stroke Father   . Heart disease Father   . Cancer Sister   . Lupus Daughter   . Hypertension Son   . Hypertension Daughter   Mother had HTN died from natural causes 2 sisters: no heart problems No family H/o cardiomyopathy  Filed Vitals:   06/04/16 1200  BP: 98/64  Pulse: 74  Weight: 159 lb 4 oz (72.235 kg)    PHYSICAL EXAM: General:  Elderly male NAD. No respiratory difficulty HEENT: normal Neck: supple. JVP 12 with prominent CV waves Carotids 2+ bilat; no bruits. No lymphadenopathy or thryomegaly appreciated. Cor: PMI nondisplaced. Regular rate & rhythm. 2/6 TR  Lungs: clear Abdomen: soft, nontender, nondistended. Liver edge down slightly No bruits or masses. Good bowel sounds. Extremities: no cyanosis, clubbing, rash, 3+  edema (compression stockings on) into thighs Neuro: alert & oriented x 3, cranial nerves grossly intact. moves all 4 extremities w/o difficulty. Affect pleasant.   ASSESSMENT & PLAN: 1. 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 This has been confirmed with  technitium pyrophosphate scan indicating diffuse TTR amyloid     --Now with end-stage restrictive CM with cardiorenal syndrome. Long talk about needing to balance fluid status with renal function and that we will never be able to get all the fluid off without dropping his BP or worsening his renal function.      --Agree with Nephrology plan to increase his diuretics back watching renal function very closely. Start midodrine 5 tid to help with orthostasis.      --We also talked about Hospice and we wil make the referral to get them involved with his care.  2. PAF    --Back in NSR after DC-CV in 5/17. We did ECG  and device interrogation today and both confirm NSR>     --CHADSVASC = 4 This patients CHA2DS2-VASc Score and unadjusted Ischemic Stroke Rate (% per year) is equal to 4.8 % stroke rate/year from a score of 4  3. Syncope, Recurrent     --Likely due to orthostasis. ICD interrogated personally in Clinic and no events. Add midodrine 5 tid.  4. St Jude ICD    --Now at [facility]. Following with Dr. Caryl Comes. 5. H/o PE     --remains on apixaban 6. Bladder CA   Shellie Rogoff,MD 12:18 PM

## 2016-06-05 ENCOUNTER — Telehealth (HOSPITAL_COMMUNITY): Payer: Self-pay | Admitting: *Deleted

## 2016-06-05 DIAGNOSIS — E854 Organ-limited amyloidosis: Secondary | ICD-10-CM | POA: Insufficient documentation

## 2016-06-05 DIAGNOSIS — I43 Cardiomyopathy in diseases classified elsewhere: Secondary | ICD-10-CM

## 2016-06-05 NOTE — Telephone Encounter (Signed)
[facility] [phone]) in [place] to make referral for pt.  Records faxed to them at [phone]

## 2016-06-10 ENCOUNTER — Telehealth: Payer: Self-pay | Admitting: Internal Medicine

## 2016-06-11 ENCOUNTER — Encounter: Payer: Self-pay | Admitting: *Deleted

## 2016-06-14 NOTE — Addendum Note (Signed)
Encounter addended by: Jolaine Artist, MD on: 06/14/2016  2:32 PM<BR>     Documentation filed: Clinical Notes, Flowsheet VN

## 2016-06-14 NOTE — Progress Notes (Signed)
Discussed end-of-life issues with patient, his wife and daughter. They would like to discuss among themselves. Palliative Care consult placed as well.

## 2016-06-15 ENCOUNTER — Ambulatory Visit: Payer: Medicare Other | Admitting: Neurology

## 2016-06-16 ENCOUNTER — Encounter: Payer: Self-pay | Admitting: Internal Medicine

## 2016-06-29 ENCOUNTER — Telehealth: Payer: Self-pay | Admitting: Cardiology

## 2016-06-29 NOTE — Telephone Encounter (Signed)
Robert w/ hospice called wanting pt ICD turned off. Informed him to fax an order and we would have MD sign it and have a rep come to pt home and turn of device. Robert verbalized understanding.

## 2016-07-01 NOTE — Telephone Encounter (Signed)
Spoke to hospice nurse regarding order to d/c therapies. Hospice nurse Cheryll Cockayne) said that their MD was on vaction until next week, so he would send [place] the order on either Monday or Tuesday.  Cheryll Cockayne asked that I send him an email with the office's phone and fax numbers, bc he's driving.  Fax sent.

## 2016-07-09 ENCOUNTER — Encounter (HOSPITAL_COMMUNITY): Payer: Medicare Other | Admitting: Internal Medicine

## 2016-07-09 ENCOUNTER — Ambulatory Visit (HOSPITAL_COMMUNITY)
Admission: RE | Admit: 2016-07-09 | Discharge: 2016-07-09 | Disposition: A | Discharge status: Home / Self Care | Payer: No Typology Code available for payment source | Source: Ambulatory Visit | Attending: Internal Medicine | Admitting: Internal Medicine

## 2016-07-09 ENCOUNTER — Encounter (HOSPITAL_COMMUNITY): Payer: Self-pay | Admitting: Internal Medicine

## 2016-07-09 VITALS — BP 106/68 | HR 72 | Wt 142.5 lb

## 2016-07-09 DIAGNOSIS — E039 Hypothyroidism, unspecified: Secondary | ICD-10-CM | POA: Diagnosis not present

## 2016-07-09 DIAGNOSIS — I495 Sick sinus syndrome: Secondary | ICD-10-CM | POA: Diagnosis not present

## 2016-07-09 DIAGNOSIS — I43 Cardiomyopathy in diseases classified elsewhere: Secondary | ICD-10-CM | POA: Diagnosis not present

## 2016-07-09 DIAGNOSIS — N189 Chronic kidney disease, unspecified: Secondary | ICD-10-CM | POA: Diagnosis not present

## 2016-07-09 DIAGNOSIS — Z7901 Long term (current) use of anticoagulants: Secondary | ICD-10-CM | POA: Insufficient documentation

## 2016-07-09 DIAGNOSIS — E854 Organ-limited amyloidosis: Secondary | ICD-10-CM | POA: Diagnosis not present

## 2016-07-09 DIAGNOSIS — E785 Hyperlipidemia, unspecified: Secondary | ICD-10-CM | POA: Insufficient documentation

## 2016-07-09 DIAGNOSIS — R55 Syncope and collapse: Secondary | ICD-10-CM | POA: Diagnosis not present

## 2016-07-09 DIAGNOSIS — I5043 Acute on chronic combined systolic (congestive) and diastolic (congestive) heart failure: Secondary | ICD-10-CM | POA: Insufficient documentation

## 2016-07-09 DIAGNOSIS — Z86711 Personal history of pulmonary embolism: Secondary | ICD-10-CM | POA: Diagnosis not present

## 2016-07-09 DIAGNOSIS — Z8249 Family history of ischemic heart disease and other diseases of the circulatory system: Secondary | ICD-10-CM | POA: Insufficient documentation

## 2016-07-09 DIAGNOSIS — I130 Hypertensive heart and chronic kidney disease with heart failure and stage 1 through stage 4 chronic kidney disease, or unspecified chronic kidney disease: Secondary | ICD-10-CM | POA: Insufficient documentation

## 2016-07-09 DIAGNOSIS — I5042 Chronic combined systolic (congestive) and diastolic (congestive) heart failure: Secondary | ICD-10-CM

## 2016-07-09 DIAGNOSIS — Z95810 Presence of automatic (implantable) cardiac defibrillator: Secondary | ICD-10-CM | POA: Diagnosis not present

## 2016-07-09 DIAGNOSIS — Z888 Allergy status to other drugs, medicaments and biological substances status: Secondary | ICD-10-CM | POA: Insufficient documentation

## 2016-07-09 DIAGNOSIS — I480 Paroxysmal atrial fibrillation: Secondary | ICD-10-CM

## 2016-07-09 LAB — BASIC METABOLIC PANEL
ANION GAP: 8 (ref 5–15)
BUN: 35 mg/dL — ABNORMAL HIGH (ref 6–20)
CHLORIDE: 91 mmol/L — AB (ref 101–111)
CO2: 33 mmol/L — AB (ref 22–32)
Calcium: 9.2 mg/dL (ref 8.9–10.3)
Creatinine, Ser: 1.32 mg/dL — ABNORMAL HIGH (ref 0.61–1.24)
GFR calc non Af Amer: 53 mL/min — ABNORMAL LOW (ref >60)
Glucose, Bld: 74 mg/dL (ref 65–99)
Potassium: 3.4 mmol/L — ABNORMAL LOW (ref 3.5–5.1)
Sodium: 132 mmol/L — ABNORMAL LOW (ref 135–145)

## 2016-07-09 NOTE — Telephone Encounter (Signed)
Order received for ICD deactivation from [place]. Called [facility] to verify there is nothing more we need to do. She requested staff to come deactivate the device- I advised her to call [place] as we do not travel or take call like [place]. She verbalizes understanding.

## 2016-07-09 NOTE — Addendum Note (Signed)
Encounter addended by: Harvie [place], CMA on: 07/09/2016  3:11 PM<BR>    Actions taken: Order Entry activity accessed, Diagnosis association updated, Sign clinical note

## 2016-07-09 NOTE — Progress Notes (Signed)
Patient ID: Kevonte Arensdorf, male   DOB: [birth year], 70 y.o.   MRN: [id number]    ADVANCED HF CLINIC NOTE  Referring Physician: Dr. Patsi Sears ([place]) Primary Cardiologist: Dr. Chancy Milroy  HPI:  Mr. Cullers is a 70 y/o male with HTN, HL, hypertrophic/restrictive CM with recurrent syncope s/p St. Jude ICD, atrial tachycardia and PE referred by Dr. Chancy Milroy for further HF evaluation.  Mr. Rudnik has appeared to carry the diagnosis of HOCM for over 15 years based on echo findings of LVH and mild SAM. Underwent ST Jude ICD placement in 2009 due to recurrent syncope.  We saw him for the first time in consult 02/05/16 as he was struggling more with volume overload and SOB. He has multiple admissions to the hospital for LE edema requiring IV diuresis. Echo in 10/16 reportedly showed EF 50-55% with moderate- severe (progressive) LVH with increased echogenicity raising suspicion for amyloid. Fat abdominal fat pas biopsy which was normal. At that time we added metolazone 2.5 2x/week with kcl 20 each time. SPEP/UPEP negative.Ferritin ok.  Repeat echo ordered EF 40-45% with very thick speckled myocardium highly suggestive of amyloid. Severe septal HK. RV moderately decreased function. LA 4.7 cm with moderate MR.  Also found to be back in AF since 12/16 on device interrogation.   Since we last saw him underwent technetium pyrophosphate scan at [facility] suggestive of diffuse transthyretin cardiac amyloidosis.  Also underwent DC-CV of AF in 5/17.   Admitted to [facility] in June with syncope due to orthostatic hypotension. No events on ICD. Had recurrent syncope and admitted to [facility]. Bumex was cut from 1.5 tablets bid to 0.5 tab bid and metolazone stopped. Saw Dr. Iona Beard (Nephrology)who adjusted med.   Here is here for f/u: Returns with his family for f/u. Overall doing fairly well. Now on Bumex 1mg  bid. Has been off the metolazone for a week. Usually takes every Friday but holding this week due to  AKI. Weight now 142. Swelling. Dizziness improved with midodrine 5 tid. SBP runs 80-100. Has Hospice now coming to house. Sleeps in recliner. Uses O2 at night.   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 Battery life 4 months left     Past Medical History:  Diagnosis Date  . AICD (automatic cardioverter/defibrillator) present   . Atrial tachycardia ([place])   . Benign prostatic hypertrophy   . Carpal tunnel syndrome   . HTN (hypertension)   . Hyperlipidemia   . Hypotension   . Hypothyroidism   . Mitral valve insufficiency   . Obstructive cardiomyopathy ([place])   . Pulmonary emboli ([place])   . Sick sinus syndrome ([place])   . Syncope     Current Outpatient Prescriptions  Medication Sig Dispense Refill  . allopurinol (ZYLOPRIM) 100 MG tablet Take 100 mg by mouth daily.    Marland Kitchen apixaban (ELIQUIS) 5 MG TABS tablet Take 1 tablet (5 mg total) by mouth 2 (two) times daily. 60 tablet   . bumetanide (BUMEX) 1 MG tablet Take 1 mg by mouth 2 (two) times daily.    . calcitRIOL (ROCALTROL) 0.25 MCG capsule Take 0.25 mcg by mouth See admin instructions. Takes 1 cap mon-fri  Skips dose on sat and sun    . docusate sodium (COLACE) 100 MG capsule Take 100  mg by mouth 2 (two) times daily.    . ferrous sulfate 325 (65 FE) MG tablet Take 325 mg by mouth daily with breakfast.    . levothyroxine (SYNTHROID, LEVOTHROID) 88 MCG tablet Take 88 mcg by mouth daily before breakfast.    . lovastatin (MEVACOR) 20 MG tablet Take 20 mg by mouth at bedtime.    . midodrine (PROAMATINE) 5 MG tablet Take 1 tablet (5 mg total) by mouth 3 (three) times daily with meals. 90 tablet 3  . potassium chloride SA (K-DUR,KLOR-CON) 20 MEQ tablet Take 10 mEq by mouth every other day.    . tamsulosin (FLOMAX) 0.4 MG CAPS capsule Take 0.4 mg by mouth daily  after supper.     No current facility-administered medications for this encounter.     Allergies  Allergen Reactions  . Ace Inhibitors Swelling      Social History   Social History  . Marital status: Married    Spouse name: N/A  . Number of children: 3  . Years of education: N/A   Occupational History  . retired    Social History Main Topics  . Smoking status: Never Smoker  . Smokeless tobacco: Never Used  . Alcohol use No  . Drug use: No  . Sexual activity: Not on file   Other Topics Concern  . Not on file   Social History Narrative   Lives with wife, daughter and 2 grandkids in a 2 story home.     Retired from [employer].     Education: high school.      Family History  Problem Relation Age of Onset  . Stroke Father   . Heart disease Father   . Cancer Sister   . Lupus Daughter   . Hypertension Son   . Hypertension Daughter   Mother had HTN died from natural causes 2 sisters: no heart problems No family H/o cardiomyopathy  Vitals:   07/09/16 1413  BP: 106/68  Pulse: 72  SpO2: 98%  Weight: 142 lb 8 oz (64.6 kg)    PHYSICAL EXAM: General:  Elderly male NAD. No respiratory difficulty HEENT: normal Neck: supple. JVP 9-10 with prominent CV waves Carotids 2+ bilat; no bruits. No lymphadenopathy or thryomegaly appreciated. Cor: PMI nondisplaced. Regular rate & rhythm. 2/6 TR  Lungs: clear Abdomen: soft, nontender, nondistended. No bruits or masses. Good bowel sounds. Extremities: no cyanosis, clubbing, rash, 1+ R>L  edema  Neuro: alert & oriented x 3, cranial nerves grossly intact. moves all 4 extremities w/o difficulty. Affect pleasant.   ASSESSMENT & PLAN: 1. 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 This has been confirmed with technitium pyrophosphate scan indicating diffuse TTR amyloid     --Now with  end-stage restrictive CM with cardiorenal syndrome. Long talk about needing to balance fluid status with renal function and that we will never be able to get all the fluid off without dropping his BP or worsening his renal function.      --Continue bumex 1 bid take metolazone as needed to protect weight 142-145. Will check renal function today     --Continue Hospice Care  2. PAF    --Back in NSR after DC-CV in 5/17.    --CHADSVASC = 4 This patients CHA2DS2-VASc Score and unadjusted Ischemic Stroke Rate (% per year) is equal to 4.8 % stroke rate/year from a score of  4  3. Syncope, Recurrent     --Likely due to orthostasis.Improved with midodrine. Can increase as needed.  4. St Jude ICD    --Now at [facility]. Following with Dr. Caryl Comes. 5. H/o PE     --remains on apixaban 6. Bladder CA   Cindel Daugherty,MD 2:58 PM

## 2016-07-09 NOTE — Patient Instructions (Signed)
Routine lab work today. Will notify you of abnormal results  Follow up with Dr.Bensimhon in 3 months.  

## 2016-07-10 ENCOUNTER — Telehealth (HOSPITAL_COMMUNITY): Payer: Self-pay | Admitting: *Deleted

## 2016-07-10 MED ORDER — POTASSIUM CHLORIDE CRYS ER 20 MEQ PO TBCR: 20.0000 meq | EXTENDED_RELEASE_TABLET | Freq: Every day | ORAL | 3 refills | Status: AC

## 2016-07-10 NOTE — Telephone Encounter (Signed)
Notes Recorded by Harvie Junior, [place] on 07/10/2016 at 4:13 PM EDT Patient aware.  Patients daughter aware of lab results and medication change. Medication updated in patients chart.   ------  Notes Recorded by Jolaine Artist, MD on 07/10/2016 at 12:19 AM EDT K low. Add kcl 20 daily    Ref Range & Units 1d ago (07/09/16) 14mo ago (05/18/16) 91mo ago (05/17/16)   Sodium 135 - 145 mmol/L 132  133     Potassium 3.5 - 5.1 mmol/L 3.4  3.3  3.2    Chloride 101 - 111 mmol/L 91  95     CO2 22 - 32 mmol/L 33  28    Glucose, Bld 65 - 99 mg/dL 74 73    BUN 6 - 20 mg/dL 35  50     Creatinine, Ser 0.61 - 1.24 mg/dL 1.32  1.19    Calcium 8.9 - 10.3 mg/dL 9.2 9.3    GFR calc non Af Amer >60 mL/min 53  >60    GFR calc Af Amer >60 mL/min >60 >60CM

## 2016-07-15 ENCOUNTER — Telehealth: Payer: Self-pay | Admitting: Internal Medicine

## 2016-07-15 NOTE — Telephone Encounter (Signed)
Called to confirm device check for Friday-per dtr pt under Hospice care and as of 8-11 per Dr. Haroldine Laws, his defib was turned off. Wife in ICU in [place] and dtr not sure she can get him here but wondered if even needs to come-pls advise

## 2016-07-16 NOTE — Telephone Encounter (Signed)
No need to come

## 2016-07-16 NOTE — Telephone Encounter (Signed)
Made patient's daughter aware that per Dr. Caryl Comes, patient does not need to be seen in office.  Patient's daughter agreeable to remote transmissions every 3 months from home.  She requests that I make Dr. Caryl Comes aware that patient's ICD therapies have been deactivated by the hospice provider.  She is appreciative of assistance and denies additional questions or concerns at this time.  Merlin remote transmission scheduled for 08/06/16.

## 2016-07-17 ENCOUNTER — Encounter: Payer: Medicare Other | Admitting: Internal Medicine

## 2016-08-06 ENCOUNTER — Encounter: Payer: Medicare Other | Admitting: *Deleted

## 2016-08-06 ENCOUNTER — Telehealth: Payer: Self-pay | Admitting: Cardiology

## 2016-08-06 NOTE — Telephone Encounter (Signed)
LMOVM reminding pt to send remote transmission.   

## 2016-08-07 ENCOUNTER — Encounter: Payer: Self-pay | Admitting: Cardiology

## 2016-08-24 ENCOUNTER — Ambulatory Visit (INDEPENDENT_AMBULATORY_CARE_PROVIDER_SITE_OTHER): Payer: Medicare Other | Admitting: *Deleted

## 2016-08-24 DIAGNOSIS — I5022 Chronic systolic (congestive) heart failure: Secondary | ICD-10-CM | POA: Diagnosis not present

## 2016-08-24 DIAGNOSIS — Z95810 Presence of automatic (implantable) cardiac defibrillator: Secondary | ICD-10-CM | POA: Diagnosis not present

## 2016-08-24 NOTE — Progress Notes (Signed)
Remote ICD transmission.   

## 2016-08-26 ENCOUNTER — Encounter: Payer: Self-pay | Admitting: Cardiology

## 2016-09-22 LAB — CUP PACEART REMOTE DEVICE CHECK
Battery Remaining Longevity: 49 mo
Battery Remaining Percentage: 92 %
Battery Voltage: 3.11 V
Brady Statistic AP VS Percent: 1 %
Brady Statistic AS VS Percent: 1.3 %
Brady Statistic RA Percent Paced: 48 %
Brady Statistic RV Percent Paced: 98 %
Date Time Interrogation Session: 20170923055015
HIGH POWER IMPEDANCE MEASURED VALUE: 52 Ohm
HIGH POWER IMPEDANCE MEASURED VALUE: 52 Ohm
Implantable Lead Implant Date: 20101217
Implantable Lead Location: 753859
Lead Channel Impedance Value: 340 Ohm
Lead Channel Pacing Threshold Amplitude: 1 V
Lead Channel Pacing Threshold Pulse Width: 0.5 ms
Lead Channel Sensing Intrinsic Amplitude: 7.4 mV
Lead Channel Setting Pacing Amplitude: 2.5 V
Lead Channel Setting Pacing Amplitude: 3.5 V
Lead Channel Setting Pacing Pulse Width: 0.5 ms
Lead Channel Setting Sensing Sensitivity: 0.5 mV
MDC IDC LEAD IMPLANT DT: 20101217
MDC IDC LEAD LOCATION: 753860
MDC IDC MSMT LEADCHNL RA IMPEDANCE VALUE: 390 Ohm
MDC IDC MSMT LEADCHNL RA PACING THRESHOLD AMPLITUDE: 1.75 V
MDC IDC MSMT LEADCHNL RA PACING THRESHOLD PULSEWIDTH: 1 ms
MDC IDC MSMT LEADCHNL RA SENSING INTR AMPL: 0.5 mV
MDC IDC PG SERIAL: 7364322
MDC IDC STAT BRADY AP VP PERCENT: 48 %
MDC IDC STAT BRADY AS VP PERCENT: 51 %

## 2016-09-30 DEATH — deceased

## 2016-11-25 ENCOUNTER — Telehealth: Payer: Self-pay | Admitting: Cardiology

## 2016-11-25 ENCOUNTER — Encounter: Admitting: *Deleted

## 2016-11-25 NOTE — Telephone Encounter (Signed)
Confirmed remote transmission w/ pt daughter. She informed me that pt has passed away.

## 2016-11-27 ENCOUNTER — Encounter: Payer: Self-pay | Admitting: Cardiology

## 2017-01-05 ENCOUNTER — Other Ambulatory Visit: Payer: Self-pay | Admitting: Internal Medicine

## 2017-01-22 ENCOUNTER — Encounter: Payer: Self-pay | Admitting: Cardiology

## 2017-09-08 ENCOUNTER — Telehealth (HOSPITAL_COMMUNITY): Payer: Self-pay

## 2017-09-08 NOTE — Telephone Encounter (Signed)
Patient's daughter left VM on disability line requesting medical records of patient for insurance. Will forward to Georgeanna Lea RN who handles Dr. Clayborne Dana patient claims.  Renee Pain, RN

## 2018-02-27 IMAGING — CT CT CERVICAL SPINE W/O CM
3 of 4 series · 14 of 33 positions shown, 17 images · non-contrast
Comparison: None.

CLINICAL DATA: Bilateral hand numbness for 3 years.

EXAM:
CT CERVICAL SPINE WITHOUT CONTRAST
TECHNIQUE: Multidetector CT imaging of the cervical spine was performed without
intravenous contrast. Multiplanar CT image reconstructions were also
generated.

[Series 6: cor · coronal · 0.29mm/px · 3 of 64 slices shown]
[im 17/64  bone]
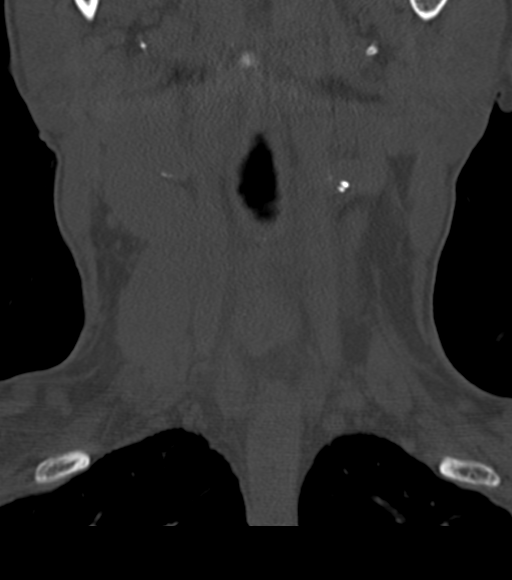
[im 27/64  bone]
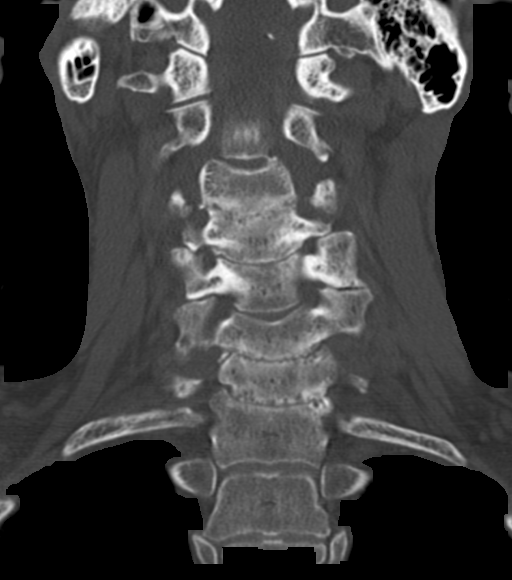
[im 37/64  bone]
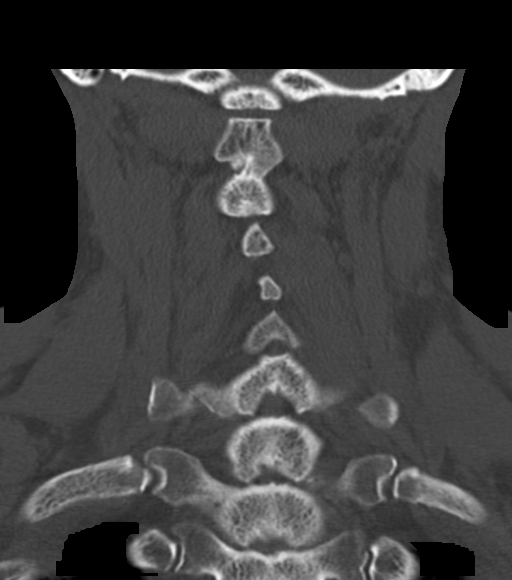

[Series 7: sag · sagittal · 0.29mm/px · 5 of 68 slices shown, 6 images]
[im 23/68  bone]
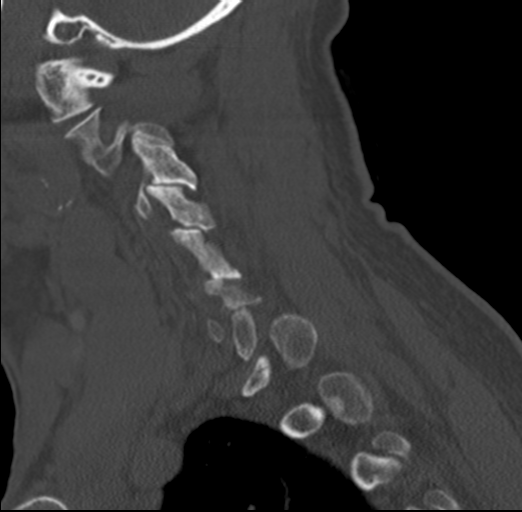
[im 28/68  bone]
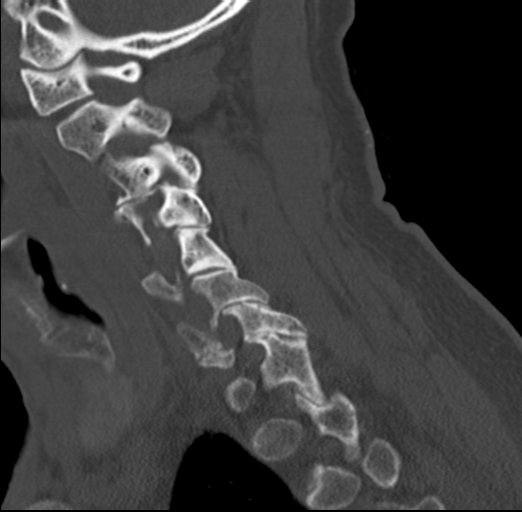
[im 34/68  soft-tissue]
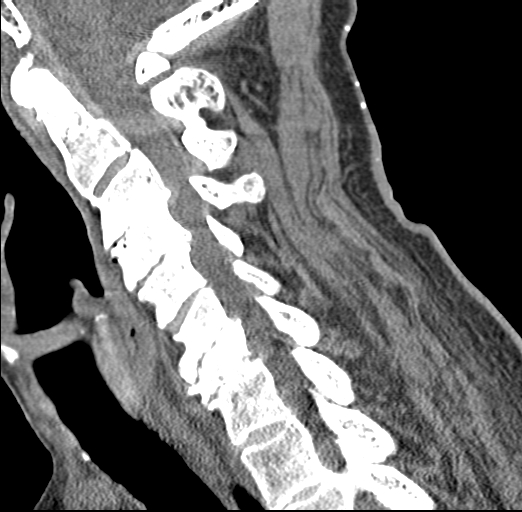
[im 34/68  bone]
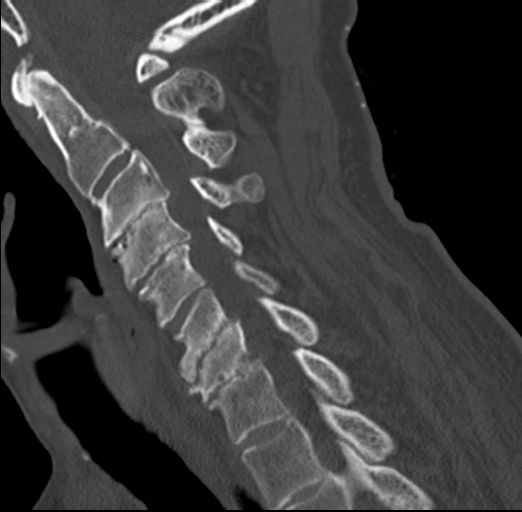
[im 40/68  bone]
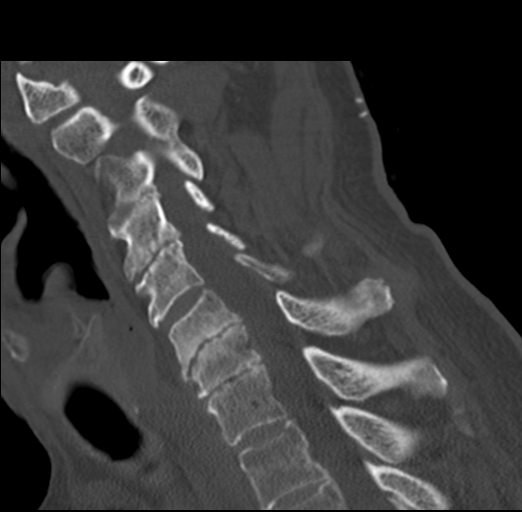
[im 45/68  bone]
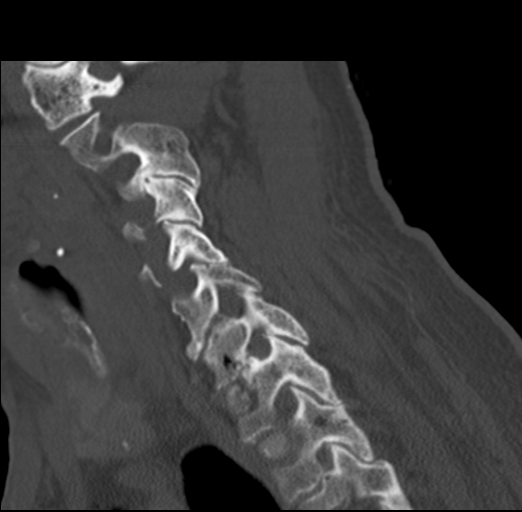

[Series 8: angled axial · axial · 0.29mm/px · z∈[-350,-229]mm · 6 of 98 slices shown, 8 images]
[im 14/98  soft-tissue]
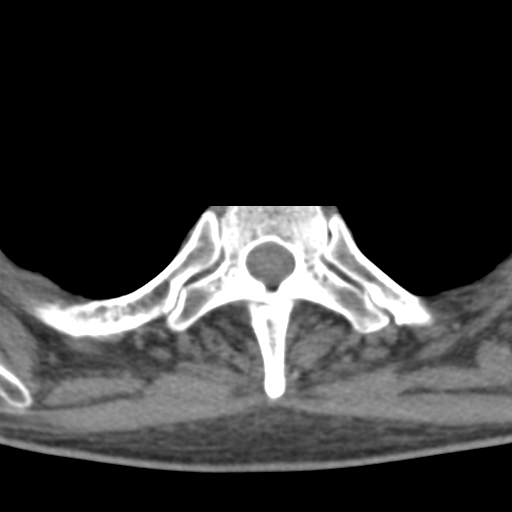
[im 14/98  bone]
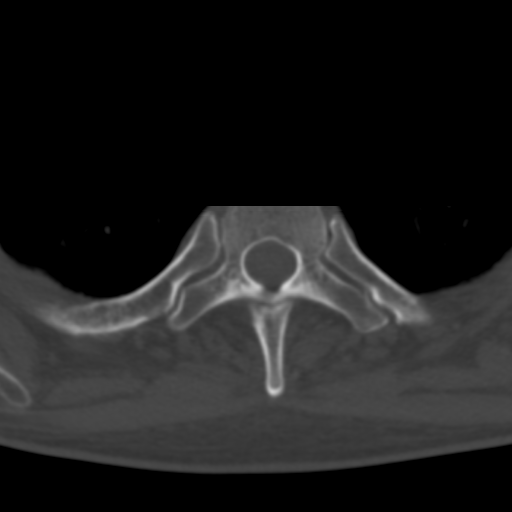
[im 28/98  bone]
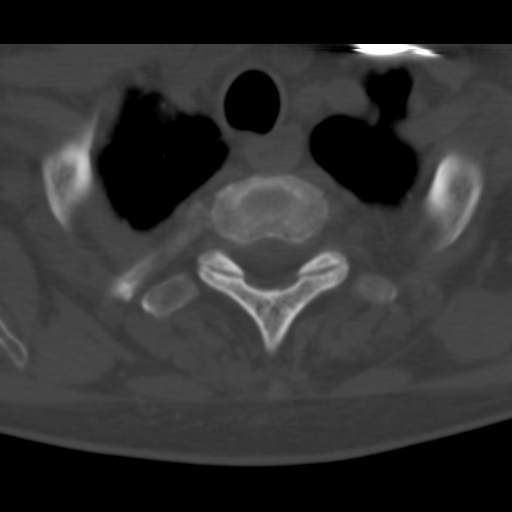
[im 42/98  bone]
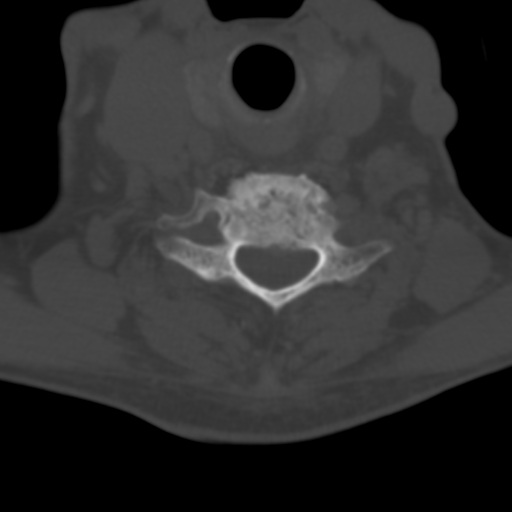
[im 56/98  bone]
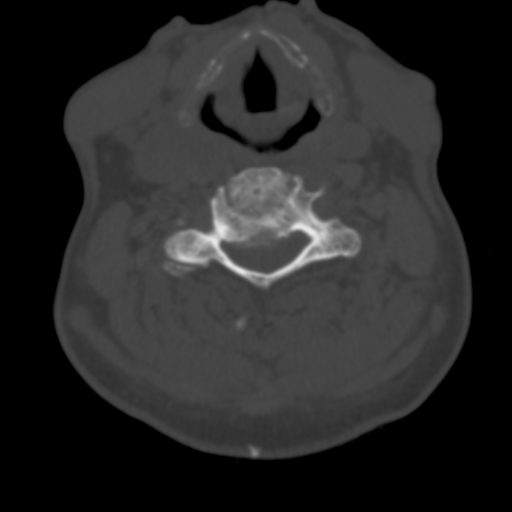
[im 70/98  soft-tissue]
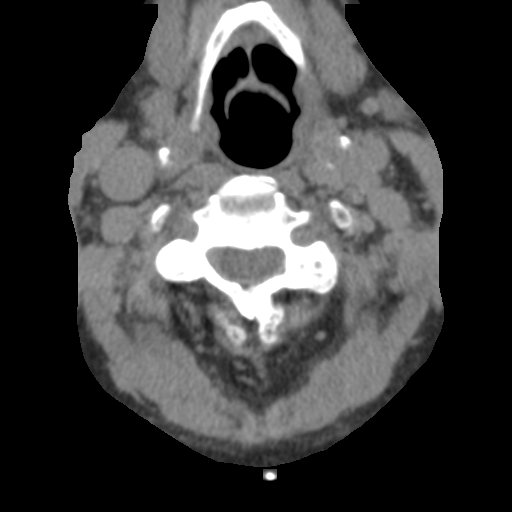
[im 70/98  bone]
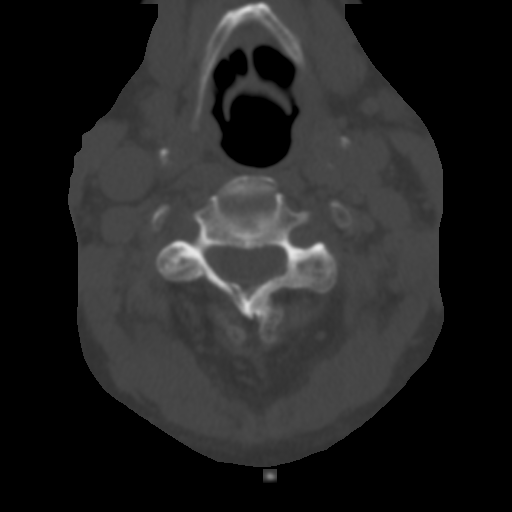
[im 84/98  bone]
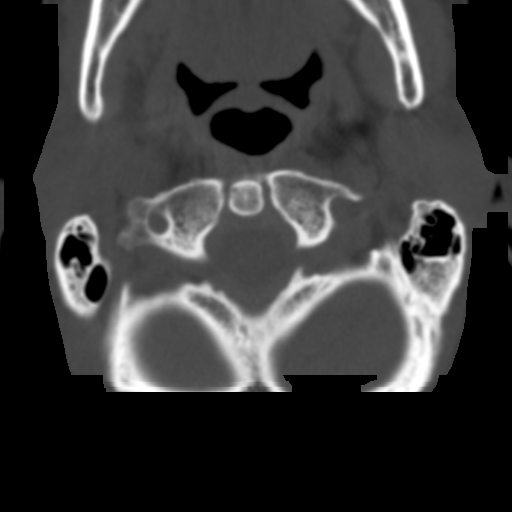

[14 of 33 positions shown; findings below may reference images not displayed]

FINDINGS: The alignment is anatomic. The vertebral body heights are
maintained. There is no acute fracture. There is no static
listhesis. The prevertebral soft tissues are normal. The intraspinal
soft tissues are not fully imaged on this examination due to poor
soft tissue contrast, but there is no gross soft tissue abnormality.

Degenerative disc disease with severe disc height loss at C3-4,
C4-5, C5-6 and C7-T1. Osseous fusion of the left posterior elements
at C2-3. Mild bilateral facet arthropathy and uncovertebral
degenerative changes at C3-4 with severe right and mild left
foraminal stenosis. Bilateral uncovertebral degenerative changes at
C4-5 with severe right foraminal stenosis. Bilateral facet
arthropathy at C5-6 with bilateral uncovertebral degenerative
changes and mild right foraminal stenosis. Broad-based disc
osteophyte complex at C6-7 with bilateral facet arthropathy and
uncovertebral degenerative changes resulting in mild bilateral
foraminal stenosis. Mild bilateral facet arthropathy and
uncovertebral degenerative changes at C7-T1 without significant
foraminal stenosis. Bilateral mild foraminal stenosis at T2-3.

The visualized portions of the lung apices demonstrate no focal
abnormality. There is bilateral carotid artery atherosclerosis.
IMPRESSION: 1. Diffuse cervical spine spondylosis as described above, right
worse than left.
